# Patient Record
Sex: Female | Born: 1975
Health system: Southern US, Community
[De-identification: ages and names within clinical notes are randomized; demographics above are authoritative.]

## PROBLEM LIST (undated history)

## (undated) ENCOUNTER — Inpatient Hospital Stay (HOSPITAL_COMMUNITY): Payer: Self-pay

## (undated) DIAGNOSIS — D649 Anemia, unspecified: Secondary | ICD-10-CM

## (undated) DIAGNOSIS — R51 Headache: Secondary | ICD-10-CM

## (undated) DIAGNOSIS — R519 Headache, unspecified: Secondary | ICD-10-CM

## (undated) HISTORY — PX: LASIK: SHX215

## (undated) HISTORY — PX: WISDOM TOOTH EXTRACTION: SHX21

---

## 2003-06-09 ENCOUNTER — Inpatient Hospital Stay (HOSPITAL_COMMUNITY): Admission: AD | Admit: 2003-06-09 | Discharge: 2003-06-09 | Payer: Self-pay | Admitting: Obstetrics and Gynecology

## 2003-09-25 ENCOUNTER — Inpatient Hospital Stay (HOSPITAL_COMMUNITY): Admission: AD | Admit: 2003-09-25 | Discharge: 2003-09-27 | Payer: Self-pay | Admitting: Obstetrics and Gynecology

## 2003-10-21 ENCOUNTER — Other Ambulatory Visit: Admission: RE | Admit: 2003-10-21 | Discharge: 2003-10-21 | Payer: Self-pay | Admitting: Obstetrics and Gynecology

## 2003-10-28 ENCOUNTER — Encounter: Admission: RE | Admit: 2003-10-28 | Discharge: 2003-11-27 | Payer: Self-pay | Admitting: Obstetrics and Gynecology

## 2003-12-28 ENCOUNTER — Encounter: Admission: RE | Admit: 2003-12-28 | Discharge: 2004-01-27 | Payer: Self-pay | Admitting: Obstetrics and Gynecology

## 2004-02-27 ENCOUNTER — Encounter: Admission: RE | Admit: 2004-02-27 | Discharge: 2004-03-28 | Payer: Self-pay | Admitting: Obstetrics and Gynecology

## 2004-03-29 ENCOUNTER — Encounter: Admission: RE | Admit: 2004-03-29 | Discharge: 2004-04-28 | Payer: Self-pay | Admitting: Obstetrics and Gynecology

## 2004-05-29 ENCOUNTER — Encounter: Admission: RE | Admit: 2004-05-29 | Discharge: 2004-06-28 | Payer: Self-pay | Admitting: Obstetrics and Gynecology

## 2004-07-29 ENCOUNTER — Encounter: Admission: RE | Admit: 2004-07-29 | Discharge: 2004-08-28 | Payer: Self-pay | Admitting: Obstetrics and Gynecology

## 2004-08-29 ENCOUNTER — Encounter: Admission: RE | Admit: 2004-08-29 | Discharge: 2004-09-28 | Payer: Self-pay | Admitting: Obstetrics and Gynecology

## 2004-10-12 ENCOUNTER — Other Ambulatory Visit: Admission: RE | Admit: 2004-10-12 | Discharge: 2004-10-12 | Payer: Self-pay | Admitting: Obstetrics and Gynecology

## 2004-10-27 ENCOUNTER — Encounter: Admission: RE | Admit: 2004-10-27 | Discharge: 2004-11-26 | Payer: Self-pay | Admitting: Obstetrics and Gynecology

## 2004-12-27 ENCOUNTER — Encounter: Admission: RE | Admit: 2004-12-27 | Discharge: 2005-01-26 | Payer: Self-pay | Admitting: Obstetrics and Gynecology

## 2013-07-22 NOTE — L&D Delivery Note (Signed)
Delivery Note At 4:37 PM, after pushing for 1 hour, a viable female was delivered via Vaginal, Spontaneous Delivery (Presentation: ; Occiput Anterior).  APGAR: 9, 9; weight  pending Placenta status: Intact, Spontaneous.  Cord: 3 vessels with the following complications: None.  Cord pH: n/a  Anesthesia: None  Episiotomy: None Lacerations: None Suture Repair: n/a Est. Blood Loss (mL):  100cc  Mom to postpartum.  Baby to Couplet care / Skin to Skin.  Marlow BaarsCLARK, Domanick Cuccia 07/16/2014, 5:00 PM

## 2013-09-09 LAB — OB RESULTS CONSOLE GC/CHLAMYDIA
Chlamydia: NEGATIVE
GC PROBE AMP, GENITAL: NEGATIVE

## 2013-12-31 LAB — OB RESULTS CONSOLE HIV ANTIBODY (ROUTINE TESTING): HIV: NONREACTIVE

## 2013-12-31 LAB — OB RESULTS CONSOLE GC/CHLAMYDIA
CHLAMYDIA, DNA PROBE: NEGATIVE
GC PROBE AMP, GENITAL: NEGATIVE

## 2013-12-31 LAB — OB RESULTS CONSOLE ABO/RH

## 2013-12-31 LAB — OB RESULTS CONSOLE HEPATITIS B SURFACE ANTIGEN: Hepatitis B Surface Ag: NEGATIVE

## 2013-12-31 LAB — OB RESULTS CONSOLE RUBELLA ANTIBODY, IGM: Rubella: IMMUNE

## 2013-12-31 LAB — OB RESULTS CONSOLE RPR: RPR: NONREACTIVE

## 2014-01-03 LAB — OB RESULTS CONSOLE ABO/RH: RH Type: POSITIVE

## 2014-04-27 LAB — OB RESULTS CONSOLE RPR: RPR: NONREACTIVE

## 2014-06-23 LAB — OB RESULTS CONSOLE GBS: STREP GROUP B AG: NEGATIVE

## 2014-07-16 ENCOUNTER — Inpatient Hospital Stay (HOSPITAL_COMMUNITY): Payer: No Typology Code available for payment source | Admitting: Anesthesiology

## 2014-07-16 ENCOUNTER — Inpatient Hospital Stay (HOSPITAL_COMMUNITY)
Admission: AD | Admit: 2014-07-16 | Discharge: 2014-07-17 | DRG: 775 | Disposition: A | Discharge status: Home / Self Care | Payer: No Typology Code available for payment source | Source: Ambulatory Visit | Attending: Obstetrics | Admitting: Obstetrics

## 2014-07-16 ENCOUNTER — Encounter (HOSPITAL_COMMUNITY): Payer: Self-pay | Admitting: *Deleted

## 2014-07-16 DIAGNOSIS — Z3A39 39 weeks gestation of pregnancy: Secondary | ICD-10-CM | POA: Diagnosis present

## 2014-07-16 DIAGNOSIS — O09523 Supervision of elderly multigravida, third trimester: Secondary | ICD-10-CM

## 2014-07-16 LAB — HIV ANTIBODY (ROUTINE TESTING W REFLEX): HIV: NONREACTIVE

## 2014-07-16 LAB — CBC
HCT: 34.3 % — ABNORMAL LOW (ref 36.0–46.0)
HEMOGLOBIN: 11.6 g/dL — AB (ref 12.0–15.0)
MCH: 32.4 pg (ref 26.0–34.0)
MCHC: 33.8 g/dL (ref 30.0–36.0)
MCV: 95.8 fL (ref 78.0–100.0)
Platelets: 342 10*3/uL (ref 150–400)
RBC: 3.58 MIL/uL — AB (ref 3.87–5.11)
RDW: 14.3 % (ref 11.5–15.5)
WBC: 9.9 10*3/uL (ref 4.0–10.5)

## 2014-07-16 LAB — TYPE AND SCREEN
ABO/RH(D): O POS
Antibody Screen: NEGATIVE

## 2014-07-16 LAB — RPR

## 2014-07-16 LAB — ABO/RH: ABO/RH(D): O POS

## 2014-07-16 MED ORDER — ONDANSETRON HCL 4 MG/2ML IJ SOLN: 4.0000 mg | INTRAMUSCULAR | Status: DC | PRN

## 2014-07-16 MED ORDER — ACETAMINOPHEN 325 MG PO TABS: 650.0000 mg | ORAL_TABLET | ORAL | Status: DC | PRN

## 2014-07-16 MED ORDER — PHENYLEPHRINE 40 MCG/ML (10ML) SYRINGE FOR IV PUSH (FOR BLOOD PRESSURE SUPPORT)
80.0000 ug | PREFILLED_SYRINGE | INTRAVENOUS | Status: DC | PRN
  Filled 2014-07-16: qty 2
  Filled 2014-07-16: qty 10

## 2014-07-16 MED ORDER — LACTATED RINGERS IV SOLN
INTRAVENOUS | Status: DC
  Administered 2014-07-16 (×4): via INTRAVENOUS

## 2014-07-16 MED ORDER — BUTORPHANOL TARTRATE 1 MG/ML IJ SOLN
1.0000 mg | INTRAMUSCULAR | Status: DC | PRN
Start: 2014-07-16 — End: 2014-07-16
  Administered 2014-07-16: 1 mg via INTRAVENOUS
  Filled 2014-07-16: qty 1

## 2014-07-16 MED ORDER — PRENATAL MULTIVITAMIN CH
1.0000 | ORAL_TABLET | Freq: Every day | ORAL | Status: DC
  Administered 2014-07-17: 1 via ORAL
  Filled 2014-07-16 (×2): qty 1

## 2014-07-16 MED ORDER — TETANUS-DIPHTH-ACELL PERTUSSIS 5-2.5-18.5 LF-MCG/0.5 IM SUSP: 0.5000 mL | Freq: Once | INTRAMUSCULAR | Status: DC

## 2014-07-16 MED ORDER — OXYCODONE-ACETAMINOPHEN 5-325 MG PO TABS: 2.0000 | ORAL_TABLET | ORAL | Status: DC | PRN

## 2014-07-16 MED ORDER — SIMETHICONE 80 MG PO CHEW: 80.0000 mg | CHEWABLE_TABLET | ORAL | Status: DC | PRN

## 2014-07-16 MED ORDER — TERBUTALINE SULFATE 1 MG/ML IJ SOLN: 0.2500 mg | Freq: Once | INTRAMUSCULAR | Status: DC | PRN

## 2014-07-16 MED ORDER — FENTANYL 2.5 MCG/ML BUPIVACAINE 1/10 % EPIDURAL INFUSION (WH - ANES)
14.0000 mL/h | INTRAMUSCULAR | Status: DC | PRN
  Administered 2014-07-16: 14 mL/h via EPIDURAL
  Filled 2014-07-16 (×2): qty 125

## 2014-07-16 MED ORDER — CITRIC ACID-SODIUM CITRATE 334-500 MG/5ML PO SOLN: 30.0000 mL | ORAL | Status: DC | PRN

## 2014-07-16 MED ORDER — OXYTOCIN 40 UNITS IN LACTATED RINGERS INFUSION - SIMPLE MED: 62.5000 mL/h | INTRAVENOUS | Status: DC

## 2014-07-16 MED ORDER — FLEET ENEMA 7-19 GM/118ML RE ENEM: 1.0000 | ENEMA | RECTAL | Status: DC | PRN

## 2014-07-16 MED ORDER — OXYTOCIN BOLUS FROM INFUSION: 500.0000 mL | INTRAVENOUS | Status: DC

## 2014-07-16 MED ORDER — SENNOSIDES-DOCUSATE SODIUM 8.6-50 MG PO TABS
2.0000 | ORAL_TABLET | ORAL | Status: DC
  Filled 2014-07-16: qty 2

## 2014-07-16 MED ORDER — LIDOCAINE HCL (PF) 1 % IJ SOLN
30.0000 mL | INTRAMUSCULAR | Status: DC | PRN
  Filled 2014-07-16: qty 30

## 2014-07-16 MED ORDER — LIDOCAINE HCL (PF) 1 % IJ SOLN
INTRAMUSCULAR | Status: DC | PRN
  Administered 2014-07-16 (×2): 4 mL

## 2014-07-16 MED ORDER — DIBUCAINE 1 % RE OINT: 1.0000 "application " | TOPICAL_OINTMENT | RECTAL | Status: DC | PRN

## 2014-07-16 MED ORDER — PHENYLEPHRINE 40 MCG/ML (10ML) SYRINGE FOR IV PUSH (FOR BLOOD PRESSURE SUPPORT)
80.0000 ug | PREFILLED_SYRINGE | INTRAVENOUS | Status: DC | PRN
  Filled 2014-07-16: qty 2

## 2014-07-16 MED ORDER — LACTATED RINGERS IV SOLN: 500.0000 mL | Freq: Once | INTRAVENOUS | Status: DC

## 2014-07-16 MED ORDER — ONDANSETRON HCL 4 MG PO TABS: 4.0000 mg | ORAL_TABLET | ORAL | Status: DC | PRN

## 2014-07-16 MED ORDER — DIPHENHYDRAMINE HCL 25 MG PO CAPS: 25.0000 mg | ORAL_CAPSULE | Freq: Four times a day (QID) | ORAL | Status: DC | PRN

## 2014-07-16 MED ORDER — LACTATED RINGERS IV SOLN: 500.0000 mL | INTRAVENOUS | Status: DC | PRN

## 2014-07-16 MED ORDER — OXYTOCIN 40 UNITS IN LACTATED RINGERS INFUSION - SIMPLE MED
1.0000 m[IU]/min | INTRAVENOUS | Status: DC
  Administered 2014-07-16: 2 m[IU]/min via INTRAVENOUS
  Filled 2014-07-16: qty 1000

## 2014-07-16 MED ORDER — OXYCODONE-ACETAMINOPHEN 5-325 MG PO TABS: 1.0000 | ORAL_TABLET | ORAL | Status: DC | PRN

## 2014-07-16 MED ORDER — LANOLIN HYDROUS EX OINT: TOPICAL_OINTMENT | CUTANEOUS | Status: DC | PRN

## 2014-07-16 MED ORDER — BENZOCAINE-MENTHOL 20-0.5 % EX AERO: 1.0000 "application " | INHALATION_SPRAY | CUTANEOUS | Status: DC | PRN

## 2014-07-16 MED ORDER — IBUPROFEN 600 MG PO TABS
600.0000 mg | ORAL_TABLET | Freq: Four times a day (QID) | ORAL | Status: DC
  Administered 2014-07-16 – 2014-07-17 (×3): 600 mg via ORAL
  Filled 2014-07-16 (×3): qty 1

## 2014-07-16 MED ORDER — EPHEDRINE 5 MG/ML INJ
10.0000 mg | INTRAVENOUS | Status: DC | PRN
  Filled 2014-07-16: qty 2

## 2014-07-16 MED ORDER — FENTANYL 2.5 MCG/ML BUPIVACAINE 1/10 % EPIDURAL INFUSION (WH - ANES)
INTRAMUSCULAR | Status: DC | PRN
  Administered 2014-07-16: 14 mL/h via EPIDURAL

## 2014-07-16 MED ORDER — WITCH HAZEL-GLYCERIN EX PADS: 1.0000 "application " | MEDICATED_PAD | CUTANEOUS | Status: DC | PRN

## 2014-07-16 MED ORDER — DIPHENHYDRAMINE HCL 50 MG/ML IJ SOLN: 12.5000 mg | INTRAMUSCULAR | Status: DC | PRN

## 2014-07-16 MED ORDER — ONDANSETRON HCL 4 MG/2ML IJ SOLN: 4.0000 mg | Freq: Four times a day (QID) | INTRAMUSCULAR | Status: DC | PRN

## 2014-07-16 NOTE — H&P (Signed)
38 y.o. G2P1 @ 3675w2d presents with complaints of ctx q2-4 min and significant pelvic pressure.  She walked for 2 hrs and was unchanged at 3cm.  Otherwise has good fetal movement and no bleeding.  Pregnancy c/b:  1. AMA: NIPT low risk for anueploidy  History reviewed. No pertinent past medical history.  Past Surgical History  Procedure Laterality Date  . Lasik      OB History  Gravida Para Term Preterm AB SAB TAB Ectopic Multiple Living  2 1        1     # Outcome Date GA Lbr Len/2nd Weight Sex Delivery Anes PTL Lv  2 Current           1 Para               PP6#5oz  History   Social History  . Marital Status: Married    Spouse Name: N/A    Number of Children: N/A  . Years of Education: N/A   Occupational History  . Not on file.   Social History Main Topics  . Smoking status: Never Smoker   . Smokeless tobacco: Not on file  . Alcohol Use: No  . Drug Use: No  . Sexual Activity: Yes   Other Topics Concern  . Not on file   Social History Narrative  . No narrative on file   Review of patient's allergies indicates no known allergies.    Prenatal Transfer Tool  Maternal Diabetes: No Genetic Screening: Normal Maternal Ultrasounds/Referrals: Normal Fetal Ultrasounds or other Referrals:  None Maternal Substance Abuse:  No Significant Maternal Medications:  None Significant Maternal Lab Results: Lab values include: Group B Strep negative  ABO, Rh: --/--/O POS (12/26 0345) Antibody: NEG (12/26 0345) Rubella:  Immune RPR: Nonreactive (10/07 0000)  HBsAg: Negative (06/12 0000)  HIV: Non-reactive (06/12 0000)  GBS: Negative (12/03 0000)    Other PNC: uncomplicated.    Filed Vitals:   07/16/14 0801  BP: 120/60  Pulse: 84  Temp: 98.1 F (36.7 C)  Resp: 18     General:  NAD Lungs: CTAB Cardiac: RRR Abdomen:  soft, gravid, EFW 7# Ex:  tr edema SVE:  3-4/70/-2/posterior FHTs:  130s, mod var, + scalp stim w exam, Cat 1 Toco:  q2-4 min   A/P   38 y.o.  5675w2d  G2P1 presents with latent labor. Was contracting painfully q2-4 minutes in MAU, but did not change cervix.  Due to distance of 1 hr to hospital, pt strongly desired admission for labor augmentation with pitocin. Declines epidural. Declines AROM at this time.  Cont to titrate up pitocin. FSR/ vtx/ GBS neg  Marlow BaarsCLARK, Keland Peyton '

## 2014-07-16 NOTE — Anesthesia Procedure Notes (Signed)
Epidural Patient location during procedure: OB  Staffing Anesthesiologist: Dejane Scheibe R Performed by: anesthesiologist   Preanesthetic Checklist Completed: patient identified, pre-op evaluation, timeout performed, IV checked, risks and benefits discussed and monitors and equipment checked  Epidural Patient position: sitting Prep: site prepped and draped and DuraPrep Patient monitoring: heart rate Approach: midline Location: L3-L4 Injection technique: LOR air and LOR saline  Needle:  Needle type: Tuohy  Needle gauge: 17 G Needle length: 9 cm Needle insertion depth: 5 cm Catheter type: closed end flexible Catheter size: 19 Gauge Catheter at skin depth: 11 cm Test dose: negative  Assessment Sensory level: T8 Events: blood not aspirated, injection not painful, no injection resistance, negative IV test and no paresthesia  Additional Notes Reason for block:procedure for pain   

## 2014-07-16 NOTE — Progress Notes (Signed)
Breathing through contractions  SVE: 3-4/70/high, AROM clear fluid EFM: 120s, mod var, cat 1 Toco: q2-3 min on pitocin  G3P1 @ 371w2d w latent labor Cont augmentation with pitocin, not yet in active phase.  FSR

## 2014-07-16 NOTE — MAU Note (Signed)
Pt to walk for one hour and then be reexamined.

## 2014-07-16 NOTE — MAU Note (Signed)
Contractions since 2000 and have gotten closer. Denies LOF. Some bloody show after membranes stripped on Weds by Dr Chestine Sporelark

## 2014-07-16 NOTE — MAU Note (Signed)
Dr. Chestine Sporelark given report about pt's progress. Pt not wanting to go home as she lives one hour away. Dr. Chestine Sporelark to admit patient with pitocin augmentation orders.

## 2014-07-16 NOTE — Anesthesia Preprocedure Evaluation (Addendum)
Anesthesia Evaluation  Patient identified by MRN, date of birth, ID band Patient awake    Reviewed: Allergy & Precautions, H&P , NPO status , Patient's Chart, lab work & pertinent test results  Airway Mallampati: II TM Distance: >3 FB Neck ROM: Full    Dental no notable dental hx.    Pulmonary neg pulmonary ROS,  breath sounds clear to auscultation  Pulmonary exam normal       Cardiovascular negative cardio ROS  Rhythm:Regular Rate:Normal     Neuro/Psych negative neurological ROS  negative psych ROS   GI/Hepatic negative GI ROS, Neg liver ROS,   Endo/Other  negative endocrine ROS  Renal/GU negative Renal ROS     Musculoskeletal negative musculoskeletal ROS (+)   Abdominal   Peds  Hematology negative hematology ROS (+)   Anesthesia Other Findings   Reproductive/Obstetrics (+) Pregnancy                           Anesthesia Physical Anesthesia Plan  ASA: II  Anesthesia Plan: Epidural   Post-op Pain Management:    Induction:   Airway Management Planned:   Additional Equipment:   Intra-op Plan:   Post-operative Plan:   Informed Consent: I have reviewed the patients History and Physical, chart, labs and discussed the procedure including the risks, benefits and alternatives for the proposed anesthesia with the patient or authorized representative who has indicated his/her understanding and acceptance.     Plan Discussed with:   Anesthesia Plan Comments:         Anesthesia Quick Evaluation  

## 2014-07-17 LAB — CBC
HCT: 31 % — ABNORMAL LOW (ref 36.0–46.0)
Hemoglobin: 10.6 g/dL — ABNORMAL LOW (ref 12.0–15.0)
MCH: 32.7 pg (ref 26.0–34.0)
MCHC: 34.2 g/dL (ref 30.0–36.0)
MCV: 95.7 fL (ref 78.0–100.0)
Platelets: 277 10*3/uL (ref 150–400)
RBC: 3.24 MIL/uL — ABNORMAL LOW (ref 3.87–5.11)
RDW: 14.2 % (ref 11.5–15.5)
WBC: 14.3 10*3/uL — ABNORMAL HIGH (ref 4.0–10.5)

## 2014-07-17 MED ORDER — IBUPROFEN 600 MG PO TABS: 600.0000 mg | ORAL_TABLET | Freq: Four times a day (QID) | ORAL | Status: DC | PRN

## 2014-07-17 NOTE — Anesthesia Postprocedure Evaluation (Signed)
  Anesthesia Post-op Note  Anesthesia Post Note  Patient: Brittany Frey  Procedure(s) Performed: * No procedures listed *  Anesthesia type: Epidural  Patient location: Mother/Baby  Post pain: Pain level controlled  Post assessment: Post-op Vital signs reviewed  Last Vitals:  Filed Vitals:   07/17/14 0609  BP: 120/63  Pulse: 78  Temp: 36.7 C  Resp:     Post vital signs: Reviewed  Level of consciousness:alert  Complications: No apparent anesthesia complications

## 2014-07-17 NOTE — Discharge Summary (Signed)
Obstetric Discharge Summary Reason for Admission: onset of labor Prenatal Procedures: none Intrapartum Procedures: spontaneous vaginal delivery Postpartum Procedures: none Complications-Operative and Postpartum: none HEMOGLOBIN  Date Value Ref Range Status  07/17/2014 10.6* 12.0 - 15.0 g/dL Final   HCT  Date Value Ref Range Status  07/17/2014 31.0* 36.0 - 46.0 % Final    Physical Exam:  General: alert, cooperative and appears stated age 15Lochia: appropriate Uterine Fundus: firm DVT Evaluation: No evidence of DVT seen on physical exam.  Discharge Diagnoses: Term Pregnancy-delivered  Discharge Information: Date: 07/17/2014 Activity: pelvic rest Diet: routine Medications: PNV and Ibuprofen Condition: stable Instructions: refer to practice specific booklet Discharge to: home   Newborn Data: Live born female  Birth Weight: 7 lb 12.5 oz (3530 g) APGAR: 9, 9  Home with mother.  Marlow BaarsCLARK, Marcial Pless 07/17/2014, 9:13 AM

## 2014-07-17 NOTE — Discharge Instructions (Signed)

## 2014-07-17 NOTE — Progress Notes (Signed)
Patient is doing well.  She is ambulating, voiding, tolerating PO.  Pain control is good.  Lochia is appropriate  Filed Vitals:   07/16/14 2015 07/16/14 2140 07/17/14 0205 07/17/14 0609  BP: 121/64 133/74 110/44 120/63  Pulse: 87 85 78 78  Temp: 98.1 F (36.7 C) 98.2 F (36.8 C) 97.8 F (36.6 C) 98 F (36.7 C)  TempSrc: Oral Oral Oral Oral  Resp: 18 18 18    Height:      Weight:      SpO2: 98% 99%  100%    NAD Fundus firm Ext:   Lab Results  Component Value Date   WBC 14.3* 07/17/2014   HGB 10.6* 07/17/2014   HCT 31.0* 07/17/2014   MCV 95.7 07/17/2014   PLT 277 07/17/2014    --/--/O POS, O POS (12/26 0345)/RImmune  A/P 38 y.o. G2P1001 PPD#1 s/p TSVD. Routine care.   Meeting all goals Expect d/c today.    [place]ARK, [facility] CampusDYANNA

## 2014-07-17 NOTE — Lactation Note (Signed)
This note was copied from the chart of Brittany Inez Dufner. Lactation Consultation Note     Initial consult with this mom and baby, now 5817 hours old. Mom is an experienced brest fedder, who says brest feeding is going well, and does not need lactation . I gave mom lactation and community services hand outs. Mom smiled and thanked me.  Patient Name: Brittany Rush FarmerChrystal Frey ZOXWR'UToday's Date: 07/17/2014 Reason for consult: Initial assessment   Maternal Data    Feeding Feeding Type: Breast Fed Length of feed: 15 min  LATCH Score/Interventions                      Lactation Tools Discussed/Used     Consult Status Consult Status: Complete    Alfred LevinsLee, [name] Anne 07/17/2014, 10:34 AM

## 2015-10-26 ENCOUNTER — Other Ambulatory Visit: Payer: Self-pay | Admitting: Obstetrics and Gynecology

## 2015-10-26 ENCOUNTER — Encounter (HOSPITAL_COMMUNITY): Payer: Self-pay | Admitting: *Deleted

## 2015-10-26 NOTE — H&P (Signed)
40 y.o. yo G2P1001at about 10 weeks with MAB.  For D&E.  Past Medical History  Diagnosis Date  . SVD (spontaneous vaginal delivery)     x 2  . Headache     Hx - last one 05/2013  . Anemia     hx   Past Surgical History  Procedure Laterality Date  . Lasik    . Wisdom tooth extraction      Social History   Social History  . Marital Status: Married    Spouse Name: N/A  . Number of Children: N/A  . Years of Education: N/A   Occupational History  . Not on file.   Social History Main Topics  . Smoking status: Never Smoker   . Smokeless tobacco: Never Used  . Alcohol Use: No  . Drug Use: No  . Sexual Activity: Yes    Birth Control/ Protection: None     Comment: approx [redacted] wks gestation   Other Topics Concern  . Not on file   Social History Narrative    No current facility-administered medications on file prior to encounter.   Current Outpatient Prescriptions on File Prior to Encounter  Medication Sig Dispense Refill  . Prenatal Vit-Fe Fumarate-FA (PRENATAL MULTIVITAMIN) TABS tablet Take 1 tablet by mouth daily at 12 noon.      No Known Allergies  @VITALS2 @  Lungs: clear to ascultation Cor:  RRR Abdomen:  soft, nontender, nondistended. Ex:  no cords, erythema Pelvic:  Closed cervix, normal uterus about 10 weeks  A:  For D&E for MAB at 10 weeks.   P: All risks, benefits and alternatives d/w patient and she desires to proceed. SCDs during the operation.   Pt is  O+.  Nona Gracey A

## 2015-10-27 ENCOUNTER — Encounter (HOSPITAL_COMMUNITY)
Admission: RE | Disposition: A | Discharge status: Home / Self Care | Payer: Self-pay | Source: Ambulatory Visit | Attending: Obstetrics and Gynecology

## 2015-10-27 ENCOUNTER — Ambulatory Visit (HOSPITAL_COMMUNITY): Payer: 59 | Admitting: Anesthesiology

## 2015-10-27 ENCOUNTER — Encounter (HOSPITAL_COMMUNITY): Payer: Self-pay | Admitting: *Deleted

## 2015-10-27 ENCOUNTER — Ambulatory Visit (HOSPITAL_COMMUNITY)
Admission: RE | Admit: 2015-10-27 | Discharge: 2015-10-27 | Disposition: A | Discharge status: Home / Self Care | Payer: 59 | Source: Ambulatory Visit | Attending: Obstetrics and Gynecology | Admitting: Obstetrics and Gynecology

## 2015-10-27 ENCOUNTER — Ambulatory Visit (HOSPITAL_COMMUNITY): Payer: 59

## 2015-10-27 DIAGNOSIS — O021 Missed abortion: Secondary | ICD-10-CM | POA: Diagnosis present

## 2015-10-27 DIAGNOSIS — O029 Abnormal product of conception, unspecified: Secondary | ICD-10-CM

## 2015-10-27 HISTORY — DX: Headache, unspecified: R51.9

## 2015-10-27 HISTORY — DX: Headache: R51

## 2015-10-27 HISTORY — PX: DILATION AND EVACUATION: SHX1459

## 2015-10-27 HISTORY — DX: Anemia, unspecified: D64.9

## 2015-10-27 LAB — CBC
HEMATOCRIT: 37.5 % (ref 36.0–46.0)
HEMOGLOBIN: 12.7 g/dL (ref 12.0–15.0)
MCH: 31.4 pg (ref 26.0–34.0)
MCHC: 33.9 g/dL (ref 30.0–36.0)
MCV: 92.8 fL (ref 78.0–100.0)
Platelets: 307 10*3/uL (ref 150–400)
RBC: 4.04 MIL/uL (ref 3.87–5.11)
RDW: 13.4 % (ref 11.5–15.5)
WBC: 5.6 10*3/uL (ref 4.0–10.5)

## 2015-10-27 SURGERY — DILATION AND EVACUATION, UTERUS
Anesthesia: Monitor Anesthesia Care

## 2015-10-27 MED ORDER — MIDAZOLAM HCL 2 MG/2ML IJ SOLN
INTRAMUSCULAR | Status: AC
  Filled 2015-10-27: qty 2

## 2015-10-27 MED ORDER — IBUPROFEN 200 MG PO TABS
200.0000 mg | ORAL_TABLET | Freq: Four times a day (QID) | ORAL | Status: DC | PRN
  Filled 2015-10-27: qty 2

## 2015-10-27 MED ORDER — FENTANYL CITRATE (PF) 100 MCG/2ML IJ SOLN: 25.0000 ug | INTRAMUSCULAR | Status: DC | PRN

## 2015-10-27 MED ORDER — LIDOCAINE HCL (CARDIAC) 20 MG/ML IV SOLN
INTRAVENOUS | Status: DC | PRN
  Administered 2015-10-27: 100 mg via INTRAVENOUS

## 2015-10-27 MED ORDER — MIDAZOLAM HCL 2 MG/2ML IJ SOLN
INTRAMUSCULAR | Status: DC | PRN
  Administered 2015-10-27: 2 mg via INTRAVENOUS

## 2015-10-27 MED ORDER — DEXAMETHASONE SODIUM PHOSPHATE 10 MG/ML IJ SOLN
INTRAMUSCULAR | Status: DC | PRN
  Administered 2015-10-27: 4 mg via INTRAVENOUS

## 2015-10-27 MED ORDER — IBUPROFEN 100 MG/5ML PO SUSP
200.0000 mg | Freq: Four times a day (QID) | ORAL | Status: DC | PRN
  Filled 2015-10-27: qty 20

## 2015-10-27 MED ORDER — METHYLERGONOVINE MALEATE 0.2 MG/ML IJ SOLN
INTRAMUSCULAR | Status: DC | PRN
  Administered 2015-10-27: 0.2 mg via INTRAMUSCULAR

## 2015-10-27 MED ORDER — SCOPOLAMINE 1 MG/3DAYS TD PT72
1.0000 | MEDICATED_PATCH | Freq: Once | TRANSDERMAL | Status: DC
  Administered 2015-10-27: 1.5 mg via TRANSDERMAL

## 2015-10-27 MED ORDER — HYDROCODONE-ACETAMINOPHEN 7.5-325 MG PO TABS: 1.0000 | ORAL_TABLET | Freq: Once | ORAL | Status: DC | PRN

## 2015-10-27 MED ORDER — MEPERIDINE HCL 25 MG/ML IJ SOLN: 6.2500 mg | INTRAMUSCULAR | Status: DC | PRN

## 2015-10-27 MED ORDER — KETOROLAC TROMETHAMINE 30 MG/ML IJ SOLN
INTRAMUSCULAR | Status: DC | PRN
  Administered 2015-10-27: 30 mg via INTRAVENOUS

## 2015-10-27 MED ORDER — ONDANSETRON HCL 4 MG/2ML IJ SOLN: 4.0000 mg | Freq: Once | INTRAMUSCULAR | Status: DC | PRN

## 2015-10-27 MED ORDER — KETOROLAC TROMETHAMINE 30 MG/ML IJ SOLN: 30.0000 mg | Freq: Once | INTRAMUSCULAR | Status: DC

## 2015-10-27 MED ORDER — ONDANSETRON HCL 4 MG/2ML IJ SOLN
INTRAMUSCULAR | Status: DC | PRN
  Administered 2015-10-27: 4 mg via INTRAVENOUS

## 2015-10-27 MED ORDER — SCOPOLAMINE 1 MG/3DAYS TD PT72
MEDICATED_PATCH | TRANSDERMAL | Status: AC
  Administered 2015-10-27: 1.5 mg via TRANSDERMAL
  Filled 2015-10-27: qty 1

## 2015-10-27 MED ORDER — PROPOFOL 10 MG/ML IV BOLUS
INTRAVENOUS | Status: DC | PRN
Start: 2015-10-27 — End: 2015-10-27
  Administered 2015-10-27: 200 mg via INTRAVENOUS
  Administered 2015-10-27: 150 mg via INTRAVENOUS

## 2015-10-27 MED ORDER — LACTATED RINGERS IV SOLN
INTRAVENOUS | Status: DC
  Administered 2015-10-27 (×3): via INTRAVENOUS

## 2015-10-27 SURGICAL SUPPLY — 18 items
CATH ROBINSON RED A/P 16FR (CATHETERS) ×3 IMPLANT
CLOTH BEACON ORANGE TIMEOUT ST (SAFETY) ×3 IMPLANT
DECANTER SPIKE VIAL GLASS SM (MISCELLANEOUS) ×3 IMPLANT
GLOVE BIO SURGEON STRL SZ7 (GLOVE) ×3 IMPLANT
GLOVE BIOGEL PI IND STRL 7.0 (GLOVE) ×2 IMPLANT
GLOVE BIOGEL PI INDICATOR 7.0 (GLOVE) ×4
GOWN STRL REUS W/TWL LRG LVL3 (GOWN DISPOSABLE) ×6 IMPLANT
KIT BERKELEY 1ST TRIMESTER 3/8 (MISCELLANEOUS) ×3 IMPLANT
NS IRRIG 1000ML POUR BTL (IV SOLUTION) ×3 IMPLANT
PACK VAGINAL MINOR WOMEN LF (CUSTOM PROCEDURE TRAY) ×3 IMPLANT
PAD OB MATERNITY 4.3X12.25 (PERSONAL CARE ITEMS) ×3 IMPLANT
PAD PREP 24X48 CUFFED NSTRL (MISCELLANEOUS) ×3 IMPLANT
SET BERKELEY SUCTION TUBING (SUCTIONS) ×3 IMPLANT
TOWEL OR 17X24 6PK STRL BLUE (TOWEL DISPOSABLE) ×6 IMPLANT
VACURETTE 10 RIGID CVD (CANNULA) ×2 IMPLANT
VACURETTE 7MM CVD STRL WRAP (CANNULA) IMPLANT
VACURETTE 8 RIGID CVD (CANNULA) IMPLANT
VACURETTE 9 RIGID CVD (CANNULA) IMPLANT

## 2015-10-27 NOTE — Anesthesia Procedure Notes (Addendum)
Date/Time: 10/27/2015 3:08 PM Performed by: Armanda HeritageSTERLING, Okley Magnussen M   Procedure Name: LMA Insertion Date/Time: 10/27/2015 2:52 PM Performed by: Quentin Shorey, Jannet AskewHARLESETTA M Pre-anesthesia Checklist: Patient identified, Timeout performed, Emergency Drugs available, Suction available and Patient being monitored Patient Re-evaluated:Patient Re-evaluated prior to inductionOxygen Delivery Method: Circle system utilized Preoxygenation: Pre-oxygenation with 100% oxygen Intubation Type: IV induction LMA: LMA inserted LMA Size: 4.0 Number of attempts: 1

## 2015-10-27 NOTE — Transfer of Care (Signed)
Immediate Anesthesia Transfer of Care Note  Patient: Mazella Viti  Procedure(s) Performed: Procedure(s): DILATATION AND EVACUATION (N/A)  Patient Location: PACU  Anesthesia Type:General  Level of Consciousness: awake, alert  and oriented  Airway & Oxygen Therapy: Patient Spontanous Breathing and Patient connected to nasal cannula oxygen  Post-op Assessment: Report given to RN and Post -op Vital signs reviewed and stable  Post vital signs: Reviewed and stable  Last Vitals:  Filed Vitals:   10/27/15 1307  BP: 107/64  Pulse: 70  Temp: 36.8 C  Resp: 16    Complications: No apparent anesthesia complications

## 2015-10-27 NOTE — Brief Op Note (Signed)
10/27/2015  3:07 PM  PATIENT: Brittany Frey 39 y.o. female  PRE-OPERATIVE DIAGNOSIS: MAB  POST-OPERATIVE DIAGNOSIS: Missed Abortion  PROCEDURE: Procedure(s): DILATATION AND EVACUATION (N/Frey)  SURGEON: Surgeon(s) and Role:  * Thimothy Barretta, MD - Primary  ANESTHESIA: general  EBL: Total I/O In: -  Out: 175 [Urine:75; Blood:100]  SPECIMEN: Source of Specimen: uterine contents  DISPOSITION OF SPECIMEN: PATHOLOGY  COUNTS: YES  TOURNIQUET: * No tourniquets in log *  DICTATION: .Note written in EPIC  PLAN OF CARE: Discharge to home after PACU  PATIENT DISPOSITION: PACU - hemodynamically stable.  Delay start of Pharmacological VTE agent (>24hrs) due to surgical blood loss or risk of bleeding: not applicable  Medications: Methergine  Complications: None  Findings: 10 week size uterus to 8-9 size post procedure. Good crie was achieved.  After adequate anesthesia was achieved, the patient was prepped and draped in the usual sterile fashion. The speculum was placed in the vagina and the cervix stabilized with Frey single-tooth tenaculum. The cervix was dilated with Pratt dilators and the 10 mm curette was used to remove contents of the uterus. Alternating sharp curettage with Frey curette and suction curettage was performed until all contents were removed and good crie was achieved. All instruments were removed from the vagina. The patient tolerated the procedure well.   Brittany Frey                 

## 2015-10-27 NOTE — Discharge Instructions (Signed)
DISCHARGE INSTRUCTIONS: D&C / D&E The following instructions have been prepared to help you care for yourself upon your return home.   Personal hygiene:  Use sanitary pads for vaginal drainage, not tampons.  Shower the day after your procedure.  NO tub baths, pools or Jacuzzis for 2-3 weeks.  Wipe front to back after using the bathroom.  Activity and limitations:  Do NOT drive or operate any equipment for 24 hours. The effects of anesthesia are still present and drowsiness may result.  Do NOT rest in bed all day.  Walking is encouraged.  Walk up and down stairs slowly.  You may resume your normal activity in one to two days or as indicated by your physician.  Sexual activity: NO intercourse for at least 2 weeks after the procedure, or as indicated by your physician.  Diet: Eat a light meal as desired this evening. You may resume your usual diet tomorrow.  Return to work: You may resume your work activities in one to two days or as indicated by your doctor.  What to expect after your surgery: Expect to have vaginal bleeding/discharge for 2-3 days and spotting for up to 10 days. It is not unusual to have soreness for up to 1-2 weeks. You may have a slight burning sensation when you urinate for the first day. Mild cramps may continue for a couple of days. You may have a regular period in 2-6 weeks.   NO IBUPROFEN PRODUCTS (MOTRIN, ADVIL) OR ALEVE UNTIL 9:00PM TODAY.  Call your doctor for any of the following:  Excessive vaginal bleeding, saturating and changing one pad every hour.  Inability to urinate 6 hours after discharge from hospital.  Pain not relieved by pain medication.  Fever of 100.4 F or greater.  Unusual vaginal discharge or odor.   Call for an appointment:    Patients signature: ______________________  Nurses signature ________________________  Support person's signature_______________________

## 2015-10-27 NOTE — Progress Notes (Signed)
There has been no change in the patients history, status or exam since the history and physical.  Filed Vitals:   10/26/15 1125 10/27/15 1307  BP:  107/64  Pulse:  70  Temp:  98.3 F (36.8 C)  TempSrc:  Oral  Resp:  16  Height: 5\' 4"  (1.626 m)   Weight: 83.462 kg (184 lb)   SpO2:  100%    Lab Results  Component Value Date   WBC 5.6 10/27/2015   HGB 12.7 10/27/2015   HCT 37.5 10/27/2015   MCV 92.8 10/27/2015   PLT 307 10/27/2015    Saharah Sherrow A

## 2015-10-27 NOTE — Anesthesia Preprocedure Evaluation (Signed)
Anesthesia Evaluation  Patient identified by MRN, date of birth, ID band Patient awake    Reviewed: Allergy & Precautions, H&P , NPO status , Patient's Chart, lab work & pertinent test results  Airway Mallampati: I  TM Distance: >3 FB Neck ROM: full    Dental no notable dental hx. (+) Teeth Intact   Pulmonary neg pulmonary ROS,    Pulmonary exam normal        Cardiovascular negative cardio ROS Normal cardiovascular exam     Neuro/Psych negative psych ROS   GI/Hepatic negative GI ROS, Neg liver ROS,   Endo/Other  negative endocrine ROS  Renal/GU negative Renal ROS     Musculoskeletal   Abdominal Normal abdominal exam  (+)   Peds  Hematology   Anesthesia Other Findings   Reproductive/Obstetrics negative OB ROS                             Anesthesia Physical Anesthesia Plan  ASA: I  Anesthesia Plan: MAC and General   Post-op Pain Management:    Induction: Intravenous  Airway Management Planned: Mask and LMA  Additional Equipment:   Intra-op Plan:   Post-operative Plan:   Informed Consent:   Plan Discussed with: CRNA and Surgeon  Anesthesia Plan Comments:         Anesthesia Quick Evaluation

## 2015-10-28 NOTE — Anesthesia Postprocedure Evaluation (Signed)
Anesthesia Post Note  Patient: Brittany Frey  Procedure(s) Performed: Procedure(s) (LRB): DILATATION AND EVACUATION (N/A)  Patient location during evaluation: PACU Anesthesia Type: General Level of consciousness: awake and alert Pain management: pain level controlled Vital Signs Assessment: post-procedure vital signs reviewed and stable Respiratory status: spontaneous breathing, nonlabored ventilation, respiratory function stable and patient connected to nasal cannula oxygen Cardiovascular status: blood pressure returned to baseline and stable Postop Assessment: no signs of nausea or vomiting Anesthetic complications: no    Last Vitals:  Filed Vitals:   10/27/15 1630 10/27/15 1705  BP: 109/58 120/62  Pulse: 75 77  Temp: 36.9 C 36.8 C  Resp: 24 16    Last Pain: There were no vitals filed for this visit.               Kennieth RadFitzgerald, Ketzaly Cardella E

## 2015-10-30 ENCOUNTER — Encounter (HOSPITAL_COMMUNITY): Payer: Self-pay | Admitting: Obstetrics and Gynecology

## 2015-11-06 NOTE — Op Note (Signed)
10/27/2015  3:07 PM  PATIENT: Brittany Frey 40 y.o. female  PRE-OPERATIVE DIAGNOSIS: MAB  POST-OPERATIVE DIAGNOSIS: Missed Abortion  PROCEDURE: Procedure(s): DILATATION AND EVACUATION (N/Frey)  SURGEON: Surgeon(s) and Role:  * Carrington ClampMichelle Araina Butrick, MD - Primary  ANESTHESIA: general  EBL: Total I/O In: -  Out: 175 [Urine:75; Blood:100]  SPECIMEN: Source of Specimen: uterine contents  DISPOSITION OF SPECIMEN: PATHOLOGY  COUNTS: YES  TOURNIQUET: * No tourniquets in log *  DICTATION: .Note written in EPIC  PLAN OF CARE: Discharge to home after PACU  PATIENT DISPOSITION: PACU - hemodynamically stable.  Delay start of Pharmacological VTE agent (>24hrs) due to surgical blood loss or risk of bleeding: not applicable  Medications: Methergine  Complications: None  Findings: 10 week size uterus to 8-9 size post procedure. Good crie was achieved.  After adequate anesthesia was achieved, the patient was prepped and draped in the usual sterile fashion. The speculum was placed in the vagina and the cervix stabilized with Frey single-tooth tenaculum. The cervix was dilated with Shawnie PonsPratt dilators and the 10 mm curette was used to remove contents of the uterus. Alternating sharp curettage with Frey curette and suction curettage was performed until all contents were removed and good crie was achieved. All instruments were removed from the vagina. The patient tolerated the procedure well.   Brittany Frey

## 2015-12-06 ENCOUNTER — Other Ambulatory Visit: Payer: Self-pay | Admitting: Obstetrics and Gynecology

## 2016-03-20 DIAGNOSIS — H40023 Open angle with borderline findings, high risk, bilateral: Secondary | ICD-10-CM | POA: Diagnosis not present

## 2016-05-16 DIAGNOSIS — N925 Other specified irregular menstruation: Secondary | ICD-10-CM | POA: Diagnosis not present

## 2016-05-24 DIAGNOSIS — N925 Other specified irregular menstruation: Secondary | ICD-10-CM | POA: Diagnosis not present

## 2016-06-04 ENCOUNTER — Other Ambulatory Visit: Payer: Self-pay | Admitting: Obstetrics and Gynecology

## 2016-06-04 DIAGNOSIS — N925 Other specified irregular menstruation: Secondary | ICD-10-CM | POA: Diagnosis not present

## 2016-06-04 DIAGNOSIS — Z6832 Body mass index (BMI) 32.0-32.9, adult: Secondary | ICD-10-CM | POA: Diagnosis not present

## 2016-06-04 DIAGNOSIS — Z3480 Encounter for supervision of other normal pregnancy, unspecified trimester: Secondary | ICD-10-CM | POA: Diagnosis not present

## 2016-06-04 DIAGNOSIS — Z01419 Encounter for gynecological examination (general) (routine) without abnormal findings: Secondary | ICD-10-CM | POA: Diagnosis not present

## 2016-06-04 DIAGNOSIS — Z124 Encounter for screening for malignant neoplasm of cervix: Secondary | ICD-10-CM | POA: Diagnosis not present

## 2016-06-05 LAB — CYTOLOGY - PAP

## 2016-06-18 DIAGNOSIS — Z369 Encounter for antenatal screening, unspecified: Secondary | ICD-10-CM | POA: Diagnosis not present

## 2016-06-18 DIAGNOSIS — O3680X1 Pregnancy with inconclusive fetal viability, fetus 1: Secondary | ICD-10-CM | POA: Diagnosis not present

## 2016-06-18 DIAGNOSIS — O09521 Supervision of elderly multigravida, first trimester: Secondary | ICD-10-CM | POA: Diagnosis not present

## 2016-06-25 DIAGNOSIS — Z3480 Encounter for supervision of other normal pregnancy, unspecified trimester: Secondary | ICD-10-CM | POA: Diagnosis not present

## 2016-06-25 DIAGNOSIS — O09521 Supervision of elderly multigravida, first trimester: Secondary | ICD-10-CM | POA: Diagnosis not present

## 2016-06-25 MED FILL — CITRANATAL 90 DHA COMBO PAC: 90-1 & 300 | 30 days supply | Qty: 30 | Fill #0

## 2016-07-12 ENCOUNTER — Encounter (HOSPITAL_COMMUNITY): Payer: Self-pay

## 2016-07-12 ENCOUNTER — Encounter (HOSPITAL_COMMUNITY)
Admission: AD | Disposition: A | Discharge status: Home / Self Care | Payer: Self-pay | Source: Ambulatory Visit | Attending: Obstetrics and Gynecology

## 2016-07-12 ENCOUNTER — Ambulatory Visit (HOSPITAL_COMMUNITY)
Admission: AD | Admit: 2016-07-12 | Discharge: 2016-07-12 | Disposition: A | Discharge status: Home / Self Care | Payer: 59 | Source: Ambulatory Visit | Attending: Obstetrics and Gynecology | Admitting: Obstetrics and Gynecology

## 2016-07-12 ENCOUNTER — Ambulatory Visit (HOSPITAL_COMMUNITY): Payer: 59 | Admitting: Anesthesiology

## 2016-07-12 DIAGNOSIS — O021 Missed abortion: Secondary | ICD-10-CM | POA: Insufficient documentation

## 2016-07-12 DIAGNOSIS — Z6832 Body mass index (BMI) 32.0-32.9, adult: Secondary | ICD-10-CM | POA: Insufficient documentation

## 2016-07-12 DIAGNOSIS — O3680X0 Pregnancy with inconclusive fetal viability, not applicable or unspecified: Secondary | ICD-10-CM | POA: Diagnosis not present

## 2016-07-12 DIAGNOSIS — S61212A Laceration without foreign body of right middle finger without damage to nail, initial encounter: Secondary | ICD-10-CM | POA: Diagnosis not present

## 2016-07-12 DIAGNOSIS — Z79899 Other long term (current) drug therapy: Secondary | ICD-10-CM | POA: Insufficient documentation

## 2016-07-12 HISTORY — PX: DILATION AND EVACUATION: SHX1459

## 2016-07-12 LAB — CBC
HCT: 36.9 % (ref 36.0–46.0)
HEMOGLOBIN: 12.4 g/dL (ref 12.0–15.0)
MCH: 31.3 pg (ref 26.0–34.0)
MCHC: 33.6 g/dL (ref 30.0–36.0)
MCV: 93.2 fL (ref 78.0–100.0)
Platelets: 342 10*3/uL (ref 150–400)
RBC: 3.96 MIL/uL (ref 3.87–5.11)
RDW: 13.3 % (ref 11.5–15.5)
WBC: 6.5 10*3/uL (ref 4.0–10.5)

## 2016-07-12 SURGERY — DILATION AND EVACUATION, UTERUS
Anesthesia: Monitor Anesthesia Care | Site: Vagina

## 2016-07-12 MED ORDER — MEPERIDINE HCL 25 MG/ML IJ SOLN: 6.2500 mg | INTRAMUSCULAR | Status: DC | PRN

## 2016-07-12 MED ORDER — FENTANYL CITRATE (PF) 100 MCG/2ML IJ SOLN
INTRAMUSCULAR | Status: AC
  Filled 2016-07-12: qty 2

## 2016-07-12 MED ORDER — DEXAMETHASONE SODIUM PHOSPHATE 10 MG/ML IJ SOLN
INTRAMUSCULAR | Status: DC | PRN
  Administered 2016-07-12: 10 mg via INTRAVENOUS

## 2016-07-12 MED ORDER — METHYLERGONOVINE MALEATE 0.2 MG/ML IJ SOLN
INTRAMUSCULAR | Status: DC | PRN
  Administered 2016-07-12: 0.2 mg via INTRAMUSCULAR

## 2016-07-12 MED ORDER — METHYLERGONOVINE MALEATE 0.2 MG/ML IJ SOLN
INTRAMUSCULAR | Status: AC
  Filled 2016-07-12: qty 1

## 2016-07-12 MED ORDER — ONDANSETRON HCL 4 MG/2ML IJ SOLN
INTRAMUSCULAR | Status: DC | PRN
Start: 2016-07-12 — End: 2016-07-12
  Administered 2016-07-12: 4 mg via INTRAVENOUS

## 2016-07-12 MED ORDER — MIDAZOLAM HCL 2 MG/2ML IJ SOLN
INTRAMUSCULAR | Status: AC
  Filled 2016-07-12: qty 2

## 2016-07-12 MED ORDER — SCOPOLAMINE 1 MG/3DAYS TD PT72
MEDICATED_PATCH | TRANSDERMAL | Status: AC
  Administered 2016-07-12: 1.5 mg via TRANSDERMAL
  Filled 2016-07-12: qty 1

## 2016-07-12 MED ORDER — MIDAZOLAM HCL 2 MG/2ML IJ SOLN
INTRAMUSCULAR | Status: DC | PRN
  Administered 2016-07-12: 2 mg via INTRAVENOUS

## 2016-07-12 MED ORDER — KETOROLAC TROMETHAMINE 30 MG/ML IJ SOLN
INTRAMUSCULAR | Status: DC | PRN
  Administered 2016-07-12: 30 mg via INTRAVENOUS

## 2016-07-12 MED ORDER — SCOPOLAMINE 1 MG/3DAYS TD PT72
1.0000 | MEDICATED_PATCH | Freq: Once | TRANSDERMAL | Status: DC
  Administered 2016-07-12: 1.5 mg via TRANSDERMAL

## 2016-07-12 MED ORDER — LIDOCAINE HCL (CARDIAC) 20 MG/ML IV SOLN
INTRAVENOUS | Status: DC | PRN
  Administered 2016-07-12: 30 mg via INTRAVENOUS

## 2016-07-12 MED ORDER — GLYCOPYRROLATE 0.2 MG/ML IJ SOLN
INTRAMUSCULAR | Status: DC | PRN
  Administered 2016-07-12: 0.2 mg via INTRAVENOUS

## 2016-07-12 MED ORDER — ONDANSETRON HCL 4 MG/2ML IJ SOLN
INTRAMUSCULAR | Status: AC
  Filled 2016-07-12: qty 2

## 2016-07-12 MED ORDER — FENTANYL CITRATE (PF) 100 MCG/2ML IJ SOLN: 25.0000 ug | INTRAMUSCULAR | Status: DC | PRN

## 2016-07-12 MED ORDER — PROPOFOL 10 MG/ML IV BOLUS
INTRAVENOUS | Status: AC
  Filled 2016-07-12: qty 20

## 2016-07-12 MED ORDER — LACTATED RINGERS IV SOLN
INTRAVENOUS | Status: DC
  Administered 2016-07-12 (×2): via INTRAVENOUS

## 2016-07-12 MED ORDER — PROMETHAZINE HCL 25 MG/ML IJ SOLN: 6.2500 mg | INTRAMUSCULAR | Status: DC | PRN

## 2016-07-12 MED ORDER — KETOROLAC TROMETHAMINE 30 MG/ML IJ SOLN
INTRAMUSCULAR | Status: AC
  Filled 2016-07-12: qty 1

## 2016-07-12 MED ORDER — PROPOFOL 10 MG/ML IV BOLUS
INTRAVENOUS | Status: DC | PRN
  Administered 2016-07-12: 200 mg via INTRAVENOUS
  Administered 2016-07-12: 100 mg via INTRAVENOUS

## 2016-07-12 MED ORDER — FENTANYL CITRATE (PF) 100 MCG/2ML IJ SOLN
INTRAMUSCULAR | Status: DC | PRN
  Administered 2016-07-12: 50 ug via INTRAVENOUS
  Administered 2016-07-12: 100 ug via INTRAVENOUS
  Administered 2016-07-12: 50 ug via INTRAVENOUS

## 2016-07-12 MED ORDER — MIDAZOLAM HCL 2 MG/2ML IJ SOLN: 0.5000 mg | Freq: Once | INTRAMUSCULAR | Status: DC | PRN

## 2016-07-12 MED ORDER — DEXAMETHASONE SODIUM PHOSPHATE 10 MG/ML IJ SOLN
INTRAMUSCULAR | Status: AC
  Filled 2016-07-12: qty 1

## 2016-07-12 SURGICAL SUPPLY — 18 items
CATH ROBINSON RED A/P 16FR (CATHETERS) ×3 IMPLANT
CLOTH BEACON ORANGE TIMEOUT ST (SAFETY) ×3 IMPLANT
DECANTER SPIKE VIAL GLASS SM (MISCELLANEOUS) ×3 IMPLANT
GLOVE BIO SURGEON STRL SZ7 (GLOVE) ×3 IMPLANT
GLOVE BIOGEL PI IND STRL 7.0 (GLOVE) ×2 IMPLANT
GLOVE BIOGEL PI INDICATOR 7.0 (GLOVE) ×4
GOWN STRL REUS W/TWL LRG LVL3 (GOWN DISPOSABLE) ×6 IMPLANT
KIT BERKELEY 1ST TRIMESTER 3/8 (MISCELLANEOUS) ×3 IMPLANT
NS IRRIG 1000ML POUR BTL (IV SOLUTION) ×3 IMPLANT
PACK VAGINAL MINOR WOMEN LF (CUSTOM PROCEDURE TRAY) ×3 IMPLANT
PAD OB MATERNITY 4.3X12.25 (PERSONAL CARE ITEMS) ×3 IMPLANT
PAD PREP 24X48 CUFFED NSTRL (MISCELLANEOUS) ×3 IMPLANT
SET BERKELEY SUCTION TUBING (SUCTIONS) ×3 IMPLANT
TOWEL OR 17X24 6PK STRL BLUE (TOWEL DISPOSABLE) ×6 IMPLANT
VACURETTE 10 RIGID CVD (CANNULA) ×2 IMPLANT
VACURETTE 7MM CVD STRL WRAP (CANNULA) IMPLANT
VACURETTE 8 RIGID CVD (CANNULA) IMPLANT
VACURETTE 9 RIGID CVD (CANNULA) IMPLANT

## 2016-07-12 NOTE — Anesthesia Postprocedure Evaluation (Signed)
Anesthesia Post Note  Patient: Marjory Grandfield  Procedure(s) Performed: Procedure(s) (LRB): DILATATION AND EVACUATION (N/A)  Patient location during evaluation: PACU Anesthesia Type: General Level of consciousness: awake and alert, oriented and patient cooperative Pain management: pain level controlled Vital Signs Assessment: post-procedure vital signs reviewed and stable Respiratory status: spontaneous breathing, nonlabored ventilation and respiratory function stable Cardiovascular status: blood pressure returned to baseline and stable Postop Assessment: no signs of nausea or vomiting Anesthetic complications: no        Last Vitals:  Vitals:   07/12/16 1425 07/12/16 1430  BP: (!) 107/58 (!) 105/56  Pulse:    Resp: 19 15  Temp: 36.8 C     Last Pain:  Vitals:   07/12/16 1145  TempSrc: Oral   Pain Goal: Patients Stated Pain Goal: 3 (07/12/16 1145)               Sarah-Jane Nazario,E. Anisha Starliper

## 2016-07-12 NOTE — Op Note (Signed)
07/12/2016  1:48 PM  PATIENT:  Brittany Frey  40 y.o. female  PRE-OPERATIVE DIAGNOSIS:  MAB at 11weeks  POST-OPERATIVE DIAGNOSIS:  MAB at 11weeks  PROCEDURE:  Procedure(s): DILATATION AND EVACUATION (N/A)  SURGEON:  Surgeon(s) and Role:    * Carrington ClampMichelle Tamari Busic, MD - Primary  ANESTHESIA:   general  EBL:  Total I/O In: 1000 [I.V.:1000] Out: 100 [Blood:100]  SPECIMEN:  Source of Specimen:  uterine contents  DISPOSITION OF SPECIMEN:  PATHOLOGY  COUNTS:  YES  TOURNIQUET:  * No tourniquets in log *  DICTATION: .Note written in EPIC  PLAN OF CARE: Discharge to home after PACU  PATIENT DISPOSITION:  PACU - hemodynamically stable.   Delay start of Pharmacological VTE agent (>24hrs) due to surgical blood loss or risk of bleeding: not applicable  Medications: Methergine  Complications: None  Findings:  12 week size uterus to 9 size post procedure.  Good crie was achieved.  After adequate anesthesia was achieved, the patient was prepped and draped in the usual sterile fashion.  The speculum was placed in the vagina and the cervix stabilized with a single-tooth tenaculum.  The cervix was dilated with Shawnie PonsPratt dilators and the 10 mm curette was used to remove contents of the uterus.  Alternating sharp curettage with a curette and suction curettage was performed until all contents were removed and good crie was achieved.  All instruments were removed from the vagina.  The patient tolerated the procedure well.    Lashuna Tamashiro A

## 2016-07-12 NOTE — Brief Op Note (Signed)
07/12/2016  1:48 PM  PATIENT:  Brittany Frey  40 y.o. female  PRE-OPERATIVE DIAGNOSIS:  MAB at 11weeks  POST-OPERATIVE DIAGNOSIS:  MAB at 11weeks  PROCEDURE:  Procedure(s): DILATATION AND EVACUATION (N/Frey)  SURGEON:  Surgeon(s) and Role:    * Tecora Eustache, MD - Primary  ANESTHESIA:   general  EBL:  Total I/O In: 1000 [I.V.:1000] Out: 100 [Blood:100]  SPECIMEN:  Source of Specimen:  uterine contents  DISPOSITION OF SPECIMEN:  PATHOLOGY  COUNTS:  YES  TOURNIQUET:  * No tourniquets in log *  DICTATION: .Note written in EPIC  PLAN OF CARE: Discharge to home after PACU  PATIENT DISPOSITION:  PACU - hemodynamically stable.   Delay start of Pharmacological VTE agent (>24hrs) due to surgical blood loss or risk of bleeding: not applicable  Medications: Methergine  Complications: None  Findings:  12 week size uterus to 9 size post procedure.  Good crie was achieved.  After adequate anesthesia was achieved, the patient was prepped and draped in the usual sterile fashion.  The speculum was placed in the vagina and the cervix stabilized with Frey single-tooth tenaculum.  The cervix was dilated with Pratt dilators and the 10 mm curette was used to remove contents of the uterus.  Alternating sharp curettage with Frey curette and suction curettage was performed until all contents were removed and good crie was achieved.  All instruments were removed from the vagina.  The patient tolerated the procedure well.    Brittany Frey       

## 2016-07-12 NOTE — Discharge Instructions (Signed)

## 2016-07-12 NOTE — H&P (Signed)
40 y.o. G2P1001 with MAB at 11 weeks.  For D&E.  Past Medical History:  Diagnosis Date  . Anemia    hx  . Headache    Hx - last one 05/2013  . SVD (spontaneous vaginal delivery)    x 2   Past Surgical History:  Procedure Laterality Date  . DILATION AND EVACUATION N/A 10/27/2015   Procedure: DILATATION AND EVACUATION;  Surgeon: Carrington ClampMichelle Delvis Kau, MD;  Location: [facility];  Service: Gynecology;  Laterality: N/A;  . LASIK    . WISDOM TOOTH EXTRACTION      Social History   Social History  . Marital status: Married    Spouse name: N/A  . Number of children: N/A  . Years of education: N/A   Occupational History  . Not on file.   Social History Main Topics  . Smoking status: Never Smoker  . Smokeless tobacco: Never Used  . Alcohol use No  . Drug use: No  . Sexual activity: Yes    Birth control/ protection: None     Comment: approx [redacted] wks gestation   Other Topics Concern  . Not on file   Social History Narrative  . No narrative on file    No current facility-administered medications on file prior to encounter.    Current Outpatient Prescriptions on File Prior to Encounter  Medication Sig Dispense Refill  . acetaminophen (TYLENOL) 500 MG tablet Take 1,000 mg by mouth every 6 (six) hours as needed for mild pain or headache.    Marland Kitchen. acyclovir (ZOVIRAX) 800 MG tablet Take 1 tablet by mouth daily as needed. For coldsores    . folic acid (FOLVITE) 400 MCG tablet Take 400 mcg by mouth daily.    . Prenatal Vit-Fe Fumarate-FA (PRENATAL MULTIVITAMIN) TABS tablet Take 1 tablet by mouth daily at 12 noon.      No Known Allergies  @VITALS2 @  Lungs: clear to ascultation Cor:  RRR Abdomen:  soft, nontender, nondistended. Ex:  no cords, erythema Pelvic:  About 11 weeks, cervix closed.  A:  For D&E for MAB.  Blood type O+.  P: All risks, benefits and alternatives d/w patient and she desires to proceed.   SCDs during the operation.     Waqas Bruhl A

## 2016-07-12 NOTE — Anesthesia Preprocedure Evaluation (Signed)
Anesthesia Evaluation  Patient identified by MRN, date of birth, ID band Patient awake    Reviewed: Allergy & Precautions, NPO status , Patient's Chart, lab work & pertinent test results  History of Anesthesia Complications Negative for: history of anesthetic complications  Airway Mallampati: IV  TM Distance: >3 FB Neck ROM: Full    Dental  (+) Dental Advisory Given   Pulmonary neg pulmonary ROS,    breath sounds clear to auscultation       Cardiovascular negative cardio ROS   Rhythm:Regular Rate:Normal     Neuro/Psych negative neurological ROS     GI/Hepatic negative GI ROS, Neg liver ROS,   Endo/Other  Morbid obesity  Renal/GU negative Renal ROS     Musculoskeletal   Abdominal (+) + obese,   Peds  Hematology plt 342k   Anesthesia Other Findings   Reproductive/Obstetrics (+) Pregnancy Missed Ab                             Anesthesia Physical Anesthesia Plan  ASA: I  Anesthesia Plan: MAC   Post-op Pain Management:    Induction: Intravenous  Airway Management Planned: Natural Airway  Additional Equipment:   Intra-op Plan:   Post-operative Plan:   Informed Consent: I have reviewed the patients History and Physical, chart, labs and discussed the procedure including the risks, benefits and alternatives for the proposed anesthesia with the patient or authorized representative who has indicated his/her understanding and acceptance.   Dental advisory given  Plan Discussed with: CRNA and Surgeon  Anesthesia Plan Comments: (Plan routine monitors, MAC)        Anesthesia Quick Evaluation

## 2016-07-12 NOTE — Anesthesia Procedure Notes (Signed)
Procedure Name: LMA Insertion Date/Time: 07/12/2016 1:24 PM Performed by: Rica RecordsICKELTON, Brittany Frey Pre-anesthesia Checklist: Patient identified, Emergency Drugs available, Suction available and Patient being monitored Patient Re-evaluated:Patient Re-evaluated prior to inductionOxygen Delivery Method: Circle system utilized Preoxygenation: Pre-oxygenation with 100% oxygen Intubation Type: IV induction Ventilation: Mask ventilation without difficulty LMA: LMA inserted LMA Size: 3.0 Dental Injury: Teeth and Oropharynx as per pre-operative assessment

## 2016-07-12 NOTE — Transfer of Care (Signed)
Immediate Anesthesia Transfer of Care Note  Patient: Brittany Frey  Procedure(s) Performed: Procedure(s): DILATATION AND EVACUATION (N/A)  Patient Location: PACU  Anesthesia Type:General  Level of Consciousness: awake, alert  and oriented  Airway & Oxygen Therapy: Patient Spontanous Breathing and Patient connected to nasal cannula oxygen  Post-op Assessment: Report given to RN and Post -op Vital signs reviewed and stable  Post vital signs: Reviewed and stable  Last Vitals:  Vitals:   07/12/16 1425 07/12/16 1430  BP: (!) 107/58 (!) 105/56  Pulse:    Resp: 19 15  Temp: 36.8 C     Last Pain:  Vitals:   07/12/16 1145  TempSrc: Oral      Patients Stated Pain Goal: 3 (07/12/16 1145)  Complications: No apparent anesthesia complications

## 2016-07-16 ENCOUNTER — Encounter (HOSPITAL_COMMUNITY): Payer: Self-pay | Admitting: Obstetrics and Gynecology

## 2016-07-19 DIAGNOSIS — Z029 Encounter for administrative examinations, unspecified: Secondary | ICD-10-CM | POA: Diagnosis not present

## 2016-07-19 MED FILL — miSOPROStol 200 MCG TABS: 200 | 1 days supply | Qty: 4 | Fill #0

## 2016-07-19 MED FILL — OXYCODONE W/APAP 5/325 TAB: 5-325 | 5 days supply | Qty: 20 | Fill #0

## 2016-07-30 DIAGNOSIS — E8881 Metabolic syndrome: Secondary | ICD-10-CM | POA: Diagnosis not present

## 2016-07-30 DIAGNOSIS — M79602 Pain in left arm: Secondary | ICD-10-CM | POA: Diagnosis not present

## 2016-07-30 DIAGNOSIS — Z0000 Encounter for general adult medical examination without abnormal findings: Secondary | ICD-10-CM | POA: Diagnosis not present

## 2016-07-30 DIAGNOSIS — K219 Gastro-esophageal reflux disease without esophagitis: Secondary | ICD-10-CM | POA: Diagnosis not present

## 2016-07-30 DIAGNOSIS — B351 Tinea unguium: Secondary | ICD-10-CM | POA: Diagnosis not present

## 2016-07-30 DIAGNOSIS — E669 Obesity, unspecified: Secondary | ICD-10-CM | POA: Diagnosis not present

## 2016-08-01 MED FILL — TERBINAFINE HCL 250 MG TAB: 250 | 45 days supply | Qty: 45 | Fill #0

## 2016-08-09 ENCOUNTER — Emergency Department (HOSPITAL_COMMUNITY)
Admission: EM | Admit: 2016-08-09 | Discharge: 2016-08-10 | Disposition: A | Discharge status: Home / Self Care | Payer: 59 | Attending: Emergency Medicine | Admitting: Emergency Medicine

## 2016-08-09 ENCOUNTER — Encounter (HOSPITAL_COMMUNITY): Payer: Self-pay | Admitting: Emergency Medicine

## 2016-08-09 DIAGNOSIS — Y939 Activity, unspecified: Secondary | ICD-10-CM | POA: Diagnosis not present

## 2016-08-09 DIAGNOSIS — S61212A Laceration without foreign body of right middle finger without damage to nail, initial encounter: Secondary | ICD-10-CM | POA: Diagnosis not present

## 2016-08-09 DIAGNOSIS — Z79899 Other long term (current) drug therapy: Secondary | ICD-10-CM | POA: Insufficient documentation

## 2016-08-09 DIAGNOSIS — W25XXXA Contact with sharp glass, initial encounter: Secondary | ICD-10-CM | POA: Insufficient documentation

## 2016-08-09 DIAGNOSIS — Y999 Unspecified external cause status: Secondary | ICD-10-CM | POA: Insufficient documentation

## 2016-08-09 DIAGNOSIS — Y929 Unspecified place or not applicable: Secondary | ICD-10-CM | POA: Insufficient documentation

## 2016-08-09 MED ORDER — LIDOCAINE HCL (PF) 1 % IJ SOLN
5.0000 mL | Freq: Once | INTRAMUSCULAR | Status: AC
  Administered 2016-08-09: 5 mL
  Filled 2016-08-09: qty 5

## 2016-08-09 NOTE — ED Notes (Signed)
See EDP assessment 

## 2016-08-09 NOTE — ED Triage Notes (Signed)
Pt reports cleaning when she dropped a crockpot and it shattered, putting a laceration to her right hand middle finger. Pt reports it was numb at first and now the feeling is returning. Pt pulses 4+/4+ and cap refill WNL.

## 2016-08-09 NOTE — ED Provider Notes (Signed)
[facility] Provider Note   CSN: [id number] Arrival date & time:      By signing my name below, I, Avnee Patel, attest that this documentation has been prepared under the direction and in the presence of  Kerrie Buffalo, NP. Electronically Signed: Clovis Pu, ED Scribe. 08/09/16. 11:11 PM.   History   Chief Complaint Chief Complaint  Patient presents with  . Extremity Laceration   The history is provided by the patient. No language interpreter was used.  Laceration   The incident occurred 1 to 2 hours ago. The laceration is located on the right hand. The laceration mechanism was a broken glass. The pain is moderate. She reports no foreign bodies present. Her tetanus status is UTD.   HPI Comments:  Brittany Frey is a 41 y.o. female who presents to the Emergency Department complaining of sudden onset, moderate laceration to her right middle finger which occurred PTA. Pt reports she sustained her laceration from a cracked crock pot. No alleviating factors noted. Pt denies any other complaints at this time. Tetanus is UTD.    Past Medical History:  Diagnosis Date  . Anemia    hx  . Headache    Hx - last one 05/2013  . SVD (spontaneous vaginal delivery)    x 2    Patient Active Problem List   Diagnosis Date Noted  . Indication for care in labor or delivery 07/16/2014  . Normal spontaneous vaginal delivery 07/16/2014    Past Surgical History:  Procedure Laterality Date  . DILATION AND EVACUATION N/A 10/27/2015   Procedure: DILATATION AND EVACUATION;  Surgeon: Carrington Clamp, MD;  Location: [facility];  Service: Gynecology;  Laterality: N/A;  . DILATION AND EVACUATION N/A 07/12/2016   Procedure: DILATATION AND EVACUATION;  Surgeon: Carrington Clamp, MD;  Location: [facility];  Service: Gynecology;  Laterality: N/A;  . LASIK    . WISDOM TOOTH EXTRACTION      OB History    Gravida Para Term Preterm AB Living   2 2 1     1    SAB TAB Ectopic Multiple Live Births     0 1       Home Medications    Prior to Admission medications   Medication Sig Start Date End Date Taking? Authorizing Provider  acetaminophen (TYLENOL) 500 MG tablet Take 1,000 mg by mouth every 6 (six) hours as needed for mild pain or headache.    Historical Provider, MD  acyclovir (ZOVIRAX) 800 MG tablet Take 1 tablet by mouth daily as needed. For coldsores 10/02/15   Historical Provider, MD  folic acid (FOLVITE) 400 MCG tablet Take 400 mcg by mouth daily.    Historical Provider, MD  Prenatal Vit-Fe Fumarate-FA (PRENATAL MULTIVITAMIN) TABS tablet Take 1 tablet by mouth daily at 12 noon.    Historical Provider, MD    Family History No family history on file.  Social History Social History  Substance Use Topics  . Smoking status: Never Smoker  . Smokeless tobacco: Never Used  . Alcohol use No     Allergies   Patient has no known allergies.   Review of Systems Review of Systems  Skin: Positive for wound.  Neurological: Negative for numbness.   Physical Exam Updated Vital Signs BP 122/78   Pulse 79   Temp 98 F (36.7 C) (Oral)   Resp 18   Ht 5\' 4"  (1.626 m)   Wt 81.6 kg   SpO2 100%   BMI 30.90 kg/m  Physical Exam  Constitutional: She is oriented to person, place, and time. She appears well-developed and well-nourished. No distress.  HENT:  Head: Normocephalic and atraumatic.  Eyes: Conjunctivae are normal.  Neck: Neck supple.  Cardiovascular: Tachycardia present.   Pulmonary/Chest: Effort normal.  Musculoskeletal: Normal range of motion.       Right hand: She exhibits tenderness and laceration. She exhibits normal range of motion and normal capillary refill. Normal sensation noted. Normal strength noted. She exhibits no finger abduction.  Laceration of the finger. No tendon involvement noted. Good strength and sensation.   Neurological: She is alert and oriented to person, place, and time.  Good strength and sensation is intact.   Skin: Skin is warm and  dry.  1.5 cm v-shaped flat laceration to the right middle finger at the PIP.  Psychiatric: She has a normal mood and affect.  Nursing note and vitals reviewed.   ED Treatments / Results  DIAGNOSTIC STUDIES:  Oxygen Saturation is 99% on RA, normal by my interpretation.    COORDINATION OF CARE:  11:10 PM Discussed treatment plan with pt at bedside and pt agreed to plan.  Labs (all labs ordered are listed, but only abnormal results are displayed) Labs Reviewed - No data to display  Radiology No results found.  Procedures .Marland KitchenLaceration Repair Date/Time: 08/09/2016 11:12 PM Performed by: Janne Napoleon Authorized by: Janne Napoleon   Consent:    Consent obtained:  Verbal   Consent given by:  Patient Anesthesia (see MAR for exact dosages):    Anesthesia method:  Local infiltration   Local anesthetic:  Lidocaine 1% w/o epi Laceration details:    Location:  Finger   Finger location:  R long finger   Length (cm):  1.5 Repair type:    Repair type:  Simple Pre-procedure details:    Preparation:  Patient was prepped and draped in usual sterile fashion Exploration:    Wound exploration: wound explored through full range of motion     Contaminated: no   Treatment:    Area cleansed with:  Betadine   Amount of cleaning:  Standard   Irrigation solution:  Sterile saline   Irrigation method:  Pressure wash   Visualized foreign bodies/material removed: no   Skin repair:    Repair method:  Sutures   Suture size:  4-0   Suture material:  Prolene   Suture technique:  Simple interrupted   Number of sutures:  4 Approximation:    Approximation:  Close   Vermilion border: well-aligned   Post-procedure details:    Dressing:  Non-adherent dressing   Patient tolerance of procedure:  Tolerated well, no immediate complications    (including critical care time)  Medications Ordered in ED Medications  lidocaine (PF) (XYLOCAINE) 1 % injection 5 mL (5 mLs Infiltration Given 08/09/16 2321)      Initial Impression / Assessment and Plan / ED Course  I have reviewed the triage vital signs and the nursing notes.    Tetanus UTD. Laceration occurred < 12 hours prior to repair. Discussed laceration care with pt and answered questions. Pt to f-u for suture removal in 7 days and wound check sooner should there be signs of dehiscence or infection. Pt is hemodynamically stable with no complaints prior to dc.    Final Clinical Impressions(s) / ED Diagnoses   Final diagnoses:  Laceration of right middle finger without foreign body without damage to nail, initial encounter    New Prescriptions Discharge Medication List as of  08/09/2016 11:48 PM    I personally performed the services described in this documentation, which was scribed in my presence. The recorded information has been reviewed and is accurate.     [id number]Rumaisa Schnetzer MarshallM Shannette Tabares, NP 08/11/16 [id number]    Tomasita CrumbleAdeleke Oni, MD 08/11/16 [phone]

## 2016-08-09 NOTE — ED Notes (Signed)
Suture cart at bedside 

## 2016-09-10 MED FILL — ACYCLOVIR 800 MG TABLET: 800 | 15 days supply | Qty: 30 | Fill #0

## 2016-10-07 MED FILL — TERBINAFINE HCL 250 MG TAB: 250 | 45 days supply | Qty: 45 | Fill #1

## 2016-11-15 DIAGNOSIS — N925 Other specified irregular menstruation: Secondary | ICD-10-CM | POA: Diagnosis not present

## 2016-11-20 ENCOUNTER — Other Ambulatory Visit: Payer: Self-pay | Admitting: Obstetrics and Gynecology

## 2016-11-20 DIAGNOSIS — N925 Other specified irregular menstruation: Secondary | ICD-10-CM | POA: Diagnosis not present

## 2016-11-20 DIAGNOSIS — Z6829 Body mass index (BMI) 29.0-29.9, adult: Secondary | ICD-10-CM | POA: Diagnosis not present

## 2016-11-20 DIAGNOSIS — R87612 Low grade squamous intraepithelial lesion on cytologic smear of cervix (LGSIL): Secondary | ICD-10-CM | POA: Diagnosis not present

## 2016-11-22 LAB — CYTOLOGY - PAP

## 2016-11-28 MED FILL — TARON-C DHA CAPSULE: 53.5-38-1 | 30 days supply | Qty: 30 | Fill #0

## 2016-11-29 DIAGNOSIS — Z3480 Encounter for supervision of other normal pregnancy, unspecified trimester: Secondary | ICD-10-CM | POA: Diagnosis not present

## 2016-11-29 DIAGNOSIS — O3680X1 Pregnancy with inconclusive fetal viability, fetus 1: Secondary | ICD-10-CM | POA: Diagnosis not present

## 2016-12-07 ENCOUNTER — Encounter (HOSPITAL_COMMUNITY): Payer: Self-pay | Admitting: *Deleted

## 2016-12-07 ENCOUNTER — Inpatient Hospital Stay (HOSPITAL_COMMUNITY): Payer: 59

## 2016-12-07 ENCOUNTER — Inpatient Hospital Stay (HOSPITAL_COMMUNITY)
Admission: AD | Admit: 2016-12-07 | Discharge: 2016-12-07 | Disposition: A | Discharge status: Home / Self Care | Payer: 59 | Source: Ambulatory Visit | Attending: Obstetrics & Gynecology | Admitting: Obstetrics & Gynecology

## 2016-12-07 DIAGNOSIS — O418X10 Other specified disorders of amniotic fluid and membranes, first trimester, not applicable or unspecified: Secondary | ICD-10-CM

## 2016-12-07 DIAGNOSIS — O208 Other hemorrhage in early pregnancy: Secondary | ICD-10-CM | POA: Diagnosis not present

## 2016-12-07 DIAGNOSIS — Z3A08 8 weeks gestation of pregnancy: Secondary | ICD-10-CM | POA: Diagnosis not present

## 2016-12-07 DIAGNOSIS — O468X1 Other antepartum hemorrhage, first trimester: Secondary | ICD-10-CM

## 2016-12-07 DIAGNOSIS — O209 Hemorrhage in early pregnancy, unspecified: Secondary | ICD-10-CM

## 2016-12-07 DIAGNOSIS — O200 Threatened abortion: Secondary | ICD-10-CM | POA: Diagnosis present

## 2016-12-07 LAB — CBC WITH DIFFERENTIAL/PLATELET
Basophils Absolute: 0.1 10*3/uL (ref 0.0–0.1)
Basophils Relative: 1 %
EOS ABS: 0.2 10*3/uL (ref 0.0–0.7)
Eosinophils Relative: 2 %
HCT: 36.7 % (ref 36.0–46.0)
Hemoglobin: 12.1 g/dL (ref 12.0–15.0)
LYMPHS ABS: 3.4 10*3/uL (ref 0.7–4.0)
LYMPHS PCT: 33 %
MCH: 31.2 pg (ref 26.0–34.0)
MCHC: 33 g/dL (ref 30.0–36.0)
MCV: 94.6 fL (ref 78.0–100.0)
MONOS PCT: 5 %
Monocytes Absolute: 0.5 10*3/uL (ref 0.1–1.0)
NEUTROS PCT: 59 %
Neutro Abs: 5.9 10*3/uL (ref 1.7–7.7)
Platelets: 393 10*3/uL (ref 150–400)
RBC: 3.88 MIL/uL (ref 3.87–5.11)
RDW: 14.2 % (ref 11.5–15.5)
WBC: 10.1 10*3/uL (ref 4.0–10.5)

## 2016-12-07 LAB — POCT PREGNANCY, URINE: PREG TEST UR: POSITIVE — AB

## 2016-12-07 NOTE — MAU Note (Signed)
Will send urine to lab.

## 2016-12-07 NOTE — Progress Notes (Signed)
Written and verbal d/c instructions given and understanding voiced. 

## 2016-12-07 NOTE — Discharge Instructions (Signed)
° °Pelvic Rest °Pelvic rest may be recommended if: °· Your placenta is partially or completely covering the opening of your cervix (placenta previa). °· There is bleeding between the wall of the uterus and the amniotic sac in the first trimester of pregnancy (subchorionic hemorrhage). °· You went into labor too early (preterm labor). °Based on your overall health and the health of your baby, your health care provider will decide if pelvic rest is right for you. °How do I rest my pelvis? °For as long as told by your health care provider: °· Do not have sex, sexual stimulation, or an orgasm. °· Do not use tampons. Do not douche. Do not put anything in your vagina. °· Do not lift anything that is heavier than 10 lb (4.5 kg). °· Avoid activities that take a lot of effort (are strenuous). °· Avoid any activity in which your pelvic muscles could become strained. °When should I seek medical care? °Seek medical care if you have: °· Cramping pain in your lower abdomen. °· Vaginal discharge. °· A low, dull backache. °· Regular contractions. °· Uterine tightening. °When should I seek immediate medical care? °Seek immediate medical care if: °· You have vaginal bleeding and you are pregnant. °This information is not intended to replace advice given to you by your health care provider. Make sure you discuss any questions you have with your health care provider. °Document Released: 11/02/2010 Document Revised: 12/14/2015 Document Reviewed: 01/09/2015 °Elsevier Interactive Patient Education © 2017 [employer] °Subchorionic Hematoma °A subchorionic hematoma is a gathering of blood between the outer wall of the placenta and the inner wall of the womb (uterus). The placenta is the organ that connects the fetus to the wall of the uterus. The placenta performs the feeding, breathing (oxygen to the fetus), and waste removal (excretory work) of the fetus. °Subchorionic hematoma is the most common abnormality found on a result from  ultrasonography done during the first trimester or early second trimester of pregnancy. If there has been little or no vaginal bleeding, early small hematomas usually shrink on their own and do not affect your baby or pregnancy. The blood is gradually absorbed over 1-2 weeks. When bleeding starts later in pregnancy or the hematoma is larger or occurs in an older pregnant woman, the outcome may not be as good. Larger hematomas may get bigger, which increases the chances for miscarriage. Subchorionic hematoma also increases the risk of premature detachment of the placenta from the uterus, preterm (premature) labor, and stillbirth. °Follow these instructions at home: °· Stay on bed rest if your health care provider recommends this. Although bed rest will not prevent more bleeding or prevent a miscarriage, your health care provider may recommend bed rest until you are advised otherwise. °· Avoid heavy lifting (more than 10 lb [4.5 kg]), exercise, sexual intercourse, or douching as directed by your health care provider. °· Keep track of the number of pads you use each day and how soaked (saturated) they are. Write down this information. °· Do not use tampons. °· Keep all follow-up appointments as directed by your health care provider. Your health care provider may ask you to have follow-up blood tests or ultrasound tests or both. °Get help right away if: °· You have severe cramps in your stomach, back, abdomen, or pelvis. °· You have a fever. °· You pass large clots or tissue. Save any tissue for your health care provider to look at. °· Your bleeding increases or you become lightheaded, feel weak, or have   fainting episodes. °This information is not intended to replace advice given to you by your health care provider. Make sure you discuss any questions you have with your health care provider. °Document Released: 10/23/2006 Document Revised: 12/14/2015 Document Reviewed: 02/04/2013 °Elsevier Interactive Patient Education  © 2017 Elsevier Inc. ° °

## 2016-12-07 NOTE — MAU Provider Note (Signed)
History     CSN: [id number]  Arrival date and time: 12/07/16 2126   First Provider Initiated Contact with Patient 12/07/16 2208      Chief Complaint  Patient presents with  . Threatened Miscarriage   HPI  [name] is a 41 y.o. [id number] at Unknown who presents to MAU today with complaint of vaginal bleeding. The patient states that she had 2 normal [place] in the office earlier in the pregnancy. She states that tonight after work she noted vaginal bleeding and passed something that she felt was possible POC. She denies pain or continued bleeding. She has had 2 previous miscarriages in the last year.   OB History    Gravida Para Term Preterm AB Living   4 1 1   2 1    SAB TAB Ectopic Multiple Live Births   2     0 1      Past Medical History:  Diagnosis Date  . Anemia    hx  . Headache    Hx - last one 05/2013  . SVD (spontaneous vaginal delivery)    x 2    Past Surgical History:  Procedure Laterality Date  . DILATION AND EVACUATION N/A 10/27/2015   Procedure: DILATATION AND EVACUATION;  Surgeon: Carrington Clamp, MD;  Location: [facility];  Service: Gynecology;  Laterality: N/A;  . DILATION AND EVACUATION N/A 07/12/2016   Procedure: DILATATION AND EVACUATION;  Surgeon: Carrington Clamp, MD;  Location: [facility];  Service: Gynecology;  Laterality: N/A;  . LASIK    . WISDOM TOOTH EXTRACTION      History reviewed. No pertinent family history.  Social History  Substance Use Topics  . Smoking status: Never Smoker  . Smokeless tobacco: Never Used  . Alcohol use No    Allergies: No Known Allergies  No prescriptions prior to admission.    Review of Systems  Constitutional: Negative for fever.  Gastrointestinal: Negative for abdominal pain, constipation, diarrhea, nausea and vomiting.  Genitourinary: Positive for vaginal bleeding. Negative for vaginal discharge.   Physical Exam   Blood pressure 122/78, pulse 88, temperature 98.8 F (37.1 C), resp. rate 18, height  5\' 4"  (1.626 m), weight 172 lb (78 kg), last menstrual period 10/06/2016, unknown if currently breastfeeding.  Physical Exam  Nursing note and vitals reviewed. Constitutional: She is oriented to person, place, and time. She appears well-developed and well-nourished. No distress.  HENT:  Head: Normocephalic and atraumatic.  Cardiovascular: Normal rate.   Respiratory: Effort normal.  GI: Soft. She exhibits no distension and no mass. There is no tenderness. There is no rebound and no guarding.  Genitourinary: Uterus is enlarged. Uterus is not tender. Cervix exhibits no motion tenderness, no discharge and no friability. Right adnexum displays no mass and no tenderness. Left adnexum displays no mass and no tenderness. There is bleeding (small) in the vagina. No vaginal discharge found.  Neurological: She is alert and oriented to person, place, and time.  Skin: Skin is warm and dry. No erythema.  Psychiatric: She has a normal mood and affect.    Results for orders placed or performed during the hospital encounter of 12/07/16 (from the past 24 hour(s))  CBC with Differential/Platelet     Status: None   Collection Time: 12/07/16  9:48 PM  Result Value Ref Range   WBC 10.1 4.0 - 10.5 K/uL   RBC 3.88 3.87 - 5.11 MIL/uL   Hemoglobin 12.1 12.0 - 15.0 g/dL   HCT 14.7 82.9 - 56.2 %  MCV 94.6 78.0 - 100.0 fL   MCH 31.2 26.0 - 34.0 pg   MCHC 33.0 30.0 - 36.0 g/dL   RDW 45.414.2 09.811.5 - 11.915.5 %   Platelets 393 150 - 400 K/uL   Neutrophils Relative % 59 %   Neutro Abs 5.9 1.7 - 7.7 K/uL   Lymphocytes Relative 33 %   Lymphs Abs 3.4 0.7 - 4.0 K/uL   Monocytes Relative 5 %   Monocytes Absolute 0.5 0.1 - 1.0 K/uL   Eosinophils Relative 2 %   Eosinophils Absolute 0.2 0.0 - 0.7 K/uL   Basophils Relative 1 %   Basophils Absolute 0.1 0.0 - 0.1 K/uL  Pregnancy, urine POC     Status: Abnormal   Collection Time: 12/07/16  9:58 PM  Result Value Ref Range   Preg Test, Ur POSITIVE (A) NEGATIVE   [place] Ob Comp  Less 14 Wks  Result Date: 12/07/2016 CLINICAL DATA:  Bleeding, history of miscarriage EXAM: OBSTETRIC <14 WK US AND TRANSVAGINAL OB US TECHNIQUE: Both transabdominal and transvaginal ultrasound examinations were performed for complete evaluation of the gestation as well as the maternal uterus, adnexal regions, and pelvic cul-de-sac. Transvaginal technique was performed to assess early pregnancy. COMPARISON:  None. FINDINGS: Intrauterine gestational sac: Single Yolk sac:  Visualized Embryo:  Visualize Cardiac Activity: Visualized Heart Rate: 167  bpm CRL:  22.8  mm   8 w   6 d                  US EDC: 07/13/2017 Subchorionic hemorrhage: Small posterior 12 x 5 mm focus of hemorrhage. Maternal uterus/adnexae: Corpus luteum on the right. Normal left ovary. No free fluid. IMPRESSION: Single live intrauterine 8 week 6 day gestation with ultrasound EDC of 07/13/2017. Small posterior focus of perigestational hemorrhage measuring 12 x 5 mm. Electronically Signed   By: Tollie Ethavid  Kwon M.D.   On: 12/07/2016 23:26   [place] Ob Transvaginal  Result Date: 12/07/2016 CLINICAL DATA:  Bleeding, history of miscarriage EXAM: OBSTETRIC <14 WK US AND TRANSVAGINAL OB US TECHNIQUE: Both transabdominal and transvaginal ultrasound examinations were performed for complete evaluation of the gestation as well as the maternal uterus, adnexal regions, and pelvic cul-de-sac. Transvaginal technique was performed to assess early pregnancy. COMPARISON:  None. FINDINGS: Intrauterine gestational sac: Single Yolk sac:  Visualized Embryo:  Visualize Cardiac Activity: Visualized Heart Rate: 167  bpm CRL:  22.8  mm   8 w   6 d                  US EDC: 07/13/2017 Subchorionic hemorrhage: Small posterior 12 x 5 mm focus of hemorrhage. Maternal uterus/adnexae: Corpus luteum on the right. Normal left ovary. No free fluid. IMPRESSION: Single live intrauterine 8 week 6 day gestation with ultrasound EDC of 07/13/2017. Small posterior focus of perigestational  hemorrhage measuring 12 x 5 mm. Electronically Signed   By: Tollie Ethavid  Kwon M.D.   On: 12/07/2016 23:26    MAU Course  Procedures None  MDM +UPT CBC and US today  Discussed results with Dr. Mora ApplPinn. OK for discharge with pelvic rest and follow-up with Dr. Henderson CloudHorvath this week Assessment and Plan  A: SIUP at [id number] Small subchorionic hemorrhage Vaginal bleeding in pregnancy, first trimester   P: Discharge home Bleeding precautions and pelvic rest discussed Patient [facility] Patient may return to MAU as needed or if her condition were to change or worsen   Vonzella NippleJulie Moraima Burd, PA-C 12/08/2016,  1:52 AM

## 2016-12-07 NOTE — MAU Note (Signed)
Just had a miscarriage and passed POC at 2025. No pain. Had just gotten off work and eating dinner and felt wet and saw the POC.

## 2016-12-10 DIAGNOSIS — O3680X1 Pregnancy with inconclusive fetal viability, fetus 1: Secondary | ICD-10-CM | POA: Diagnosis not present

## 2016-12-17 DIAGNOSIS — Z3480 Encounter for supervision of other normal pregnancy, unspecified trimester: Secondary | ICD-10-CM | POA: Diagnosis not present

## 2016-12-31 MED FILL — TARON-C DHA CAPSULE: 53.5-38-1 | 30 days supply | Qty: 30 | Fill #1

## 2017-01-01 ENCOUNTER — Inpatient Hospital Stay (HOSPITAL_COMMUNITY): Payer: 59 | Admitting: Anesthesiology

## 2017-01-01 ENCOUNTER — Ambulatory Visit (HOSPITAL_COMMUNITY)
Admission: AD | Admit: 2017-01-01 | Discharge: 2017-01-01 | Disposition: A | Discharge status: Home / Self Care | Payer: 59 | Source: Ambulatory Visit | Attending: Obstetrics and Gynecology | Admitting: Obstetrics and Gynecology

## 2017-01-01 ENCOUNTER — Encounter (HOSPITAL_COMMUNITY): Payer: Self-pay | Admitting: *Deleted

## 2017-01-01 ENCOUNTER — Encounter (HOSPITAL_COMMUNITY)
Admission: AD | Disposition: A | Discharge status: Home / Self Care | Payer: Self-pay | Source: Ambulatory Visit | Attending: Obstetrics and Gynecology

## 2017-01-01 ENCOUNTER — Other Ambulatory Visit (HOSPITAL_COMMUNITY): Payer: Self-pay | Admitting: Obstetrics and Gynecology

## 2017-01-01 DIAGNOSIS — O3680X1 Pregnancy with inconclusive fetal viability, fetus 1: Secondary | ICD-10-CM | POA: Diagnosis not present

## 2017-01-01 DIAGNOSIS — O021 Missed abortion: Secondary | ICD-10-CM | POA: Insufficient documentation

## 2017-01-01 DIAGNOSIS — O3680X0 Pregnancy with inconclusive fetal viability, not applicable or unspecified: Secondary | ICD-10-CM | POA: Diagnosis not present

## 2017-01-01 DIAGNOSIS — Z79899 Other long term (current) drug therapy: Secondary | ICD-10-CM | POA: Diagnosis not present

## 2017-01-01 HISTORY — PX: DILATION AND EVACUATION: SHX1459

## 2017-01-01 LAB — CBC
HCT: 34.8 % — ABNORMAL LOW (ref 36.0–46.0)
HEMOGLOBIN: 11.7 g/dL — AB (ref 12.0–15.0)
MCH: 31.5 pg (ref 26.0–34.0)
MCHC: 33.6 g/dL (ref 30.0–36.0)
MCV: 93.5 fL (ref 78.0–100.0)
PLATELETS: 362 10*3/uL (ref 150–400)
RBC: 3.72 MIL/uL — AB (ref 3.87–5.11)
RDW: 13.8 % (ref 11.5–15.5)
WBC: 8.1 10*3/uL (ref 4.0–10.5)

## 2017-01-01 SURGERY — DILATION AND EVACUATION, UTERUS
Anesthesia: General | Site: Vagina

## 2017-01-01 MED ORDER — FENTANYL CITRATE (PF) 100 MCG/2ML IJ SOLN
INTRAMUSCULAR | Status: DC | PRN
  Administered 2017-01-01: 50 ug via INTRAVENOUS
  Administered 2017-01-01: 100 ug via INTRAVENOUS

## 2017-01-01 MED ORDER — ONDANSETRON HCL 4 MG/2ML IJ SOLN
INTRAMUSCULAR | Status: DC | PRN
  Administered 2017-01-01: 4 mg via INTRAVENOUS

## 2017-01-01 MED ORDER — METHYLERGONOVINE MALEATE 0.2 MG/ML IJ SOLN
INTRAMUSCULAR | Status: DC | PRN
  Administered 2017-01-01: 0.2 mg via INTRAMUSCULAR

## 2017-01-01 MED ORDER — FENTANYL CITRATE (PF) 100 MCG/2ML IJ SOLN: 25.0000 ug | INTRAMUSCULAR | Status: DC | PRN

## 2017-01-01 MED ORDER — DEXAMETHASONE SODIUM PHOSPHATE 4 MG/ML IJ SOLN
INTRAMUSCULAR | Status: DC | PRN
  Administered 2017-01-01: 4 mg via INTRAVENOUS

## 2017-01-01 MED ORDER — PROPOFOL 10 MG/ML IV BOLUS
INTRAVENOUS | Status: DC | PRN
  Administered 2017-01-01: 200 mg via INTRAVENOUS

## 2017-01-01 MED ORDER — LIDOCAINE HCL (CARDIAC) 20 MG/ML IV SOLN
INTRAVENOUS | Status: DC | PRN
  Administered 2017-01-01: 80 mg via INTRAVENOUS

## 2017-01-01 MED ORDER — SCOPOLAMINE 1 MG/3DAYS TD PT72
MEDICATED_PATCH | TRANSDERMAL | Status: DC | PRN
  Administered 2017-01-01: 1 via TRANSDERMAL

## 2017-01-01 MED ORDER — LACTATED RINGERS IV SOLN
INTRAVENOUS | Status: DC | PRN
  Administered 2017-01-01: 19:00:00 via INTRAVENOUS

## 2017-01-01 MED ORDER — FAMOTIDINE IN NACL 20-0.9 MG/50ML-% IV SOLN
20.0000 mg | Freq: Once | INTRAVENOUS | Status: DC
  Filled 2017-01-01: qty 50

## 2017-01-01 MED ORDER — MEPERIDINE HCL 25 MG/ML IJ SOLN: 6.2500 mg | INTRAMUSCULAR | Status: DC | PRN

## 2017-01-01 MED ORDER — OXYCODONE HCL 5 MG/5ML PO SOLN: 5.0000 mg | Freq: Once | ORAL | Status: DC | PRN

## 2017-01-01 MED ORDER — ONDANSETRON HCL 4 MG/2ML IJ SOLN
INTRAMUSCULAR | Status: AC
  Filled 2017-01-01: qty 2

## 2017-01-01 MED ORDER — LIDOCAINE HCL (CARDIAC) 20 MG/ML IV SOLN
INTRAVENOUS | Status: AC
  Filled 2017-01-01: qty 5

## 2017-01-01 MED ORDER — MIDAZOLAM HCL 2 MG/2ML IJ SOLN
INTRAMUSCULAR | Status: DC | PRN
  Administered 2017-01-01: 2 mg via INTRAVENOUS

## 2017-01-01 MED ORDER — MIDAZOLAM HCL 2 MG/2ML IJ SOLN
INTRAMUSCULAR | Status: AC
  Filled 2017-01-01: qty 2

## 2017-01-01 MED ORDER — FENTANYL CITRATE (PF) 100 MCG/2ML IJ SOLN
INTRAMUSCULAR | Status: AC
  Filled 2017-01-01: qty 2

## 2017-01-01 MED ORDER — KETOROLAC TROMETHAMINE 30 MG/ML IJ SOLN
INTRAMUSCULAR | Status: DC | PRN
  Administered 2017-01-01: 30 mg via INTRAVENOUS

## 2017-01-01 MED ORDER — OXYCODONE HCL 5 MG PO TABS: 5.0000 mg | ORAL_TABLET | Freq: Once | ORAL | Status: DC | PRN

## 2017-01-01 MED ORDER — DEXAMETHASONE SODIUM PHOSPHATE 4 MG/ML IJ SOLN
INTRAMUSCULAR | Status: AC
  Filled 2017-01-01: qty 1

## 2017-01-01 MED ORDER — LACTATED RINGERS IV SOLN
INTRAVENOUS | Status: DC
  Administered 2017-01-01: 19:00:00 via INTRAVENOUS

## 2017-01-01 MED ORDER — PROPOFOL 10 MG/ML IV BOLUS
INTRAVENOUS | Status: AC
  Filled 2017-01-01: qty 20

## 2017-01-01 MED ORDER — PROMETHAZINE HCL 25 MG/ML IJ SOLN: 6.2500 mg | INTRAMUSCULAR | Status: DC | PRN

## 2017-01-01 MED ORDER — SOD CITRATE-CITRIC ACID 500-334 MG/5ML PO SOLN
30.0000 mL | Freq: Once | ORAL | Status: AC
  Administered 2017-01-01: 30 mL via ORAL
  Filled 2017-01-01: qty 15

## 2017-01-01 SURGICAL SUPPLY — 19 items
CATH ROBINSON RED A/P 16FR (CATHETERS) ×3 IMPLANT
CLOTH BEACON ORANGE TIMEOUT ST (SAFETY) ×3 IMPLANT
DECANTER SPIKE VIAL GLASS SM (MISCELLANEOUS) ×3 IMPLANT
GLOVE BIO SURGEON STRL SZ7 (GLOVE) ×3 IMPLANT
GLOVE BIOGEL PI IND STRL 7.0 (GLOVE) ×2 IMPLANT
GLOVE BIOGEL PI INDICATOR 7.0 (GLOVE) ×4
GOWN STRL REUS W/TWL LRG LVL3 (GOWN DISPOSABLE) ×6 IMPLANT
KIT BERKELEY 1ST TRIMESTER 3/8 (MISCELLANEOUS) ×5 IMPLANT
NS IRRIG 1000ML POUR BTL (IV SOLUTION) ×3 IMPLANT
PACK VAGINAL MINOR WOMEN LF (CUSTOM PROCEDURE TRAY) ×3 IMPLANT
PAD OB MATERNITY 4.3X12.25 (PERSONAL CARE ITEMS) ×3 IMPLANT
PAD PREP 24X48 CUFFED NSTRL (MISCELLANEOUS) ×3 IMPLANT
SET BERKELEY SUCTION TUBING (SUCTIONS) ×3 IMPLANT
TOWEL OR 17X24 6PK STRL BLUE (TOWEL DISPOSABLE) ×6 IMPLANT
VACURETTE 10 RIGID CVD (CANNULA) IMPLANT
VACURETTE 12 RIGID CVD (CANNULA) ×2 IMPLANT
VACURETTE 7MM CVD STRL WRAP (CANNULA) IMPLANT
VACURETTE 8 RIGID CVD (CANNULA) IMPLANT
VACURETTE 9 RIGID CVD (CANNULA) IMPLANT

## 2017-01-01 NOTE — Transfer of Care (Signed)
Immediate Anesthesia Transfer of Care Note  Patient: Brittany Frey  Procedure(s) Performed: Procedure(s): DILATATION AND EVACUATION (N/A)  Patient Location: PACU  Anesthesia Type:General  Level of Consciousness: awake, alert  and oriented  Airway & Oxygen Therapy: Patient Spontanous Breathing and Patient connected to nasal cannula oxygen  Post-op Assessment: Report given to RN and Post -op Vital signs reviewed and stable  Post vital signs: Reviewed and stable BP 104/76, HR 96, RR 18, SaO2 100%  Last Vitals: There were no vitals filed for this visit.  Last Pain: There were no vitals filed for this visit.       Complications: No apparent anesthesia complications

## 2017-01-01 NOTE — Op Note (Signed)
01/01/2017  7:44 PM  PATIENT:  Brittany Frey  40 y.o. female  PRE-OPERATIVE DIAGNOSIS:  Missed Abortion  POST-OPERATIVE DIAGNOSIS:  Missed Abortion  PROCEDURE:  Procedure(s): DILATATION AND EVACUATION (N/A)  SURGEON:  Surgeon(s) and Role:    * Adja Ruff, MD - Primary   ANESTHESIA:   general  EBL:  Total I/O In: -  Out: 150 [Blood:150]  SPECIMEN:  Source of Specimen:  products of conception  DISPOSITION OF SPECIMEN:  PATHOLOGY  COUNTS:  YES  TOURNIQUET:  * No tourniquets in log *  DICTATION: .Note written in EPIC  PLAN OF CARE: Discharge to home after PACU  PATIENT DISPOSITION:  PACU - hemodynamically stable.   Delay start of Pharmacological VTE agent (>24hrs) due to surgical blood loss or risk of bleeding: not applicable  Medications: Methergine  Complications: None  Findings:  12 week size uterus to 9 size post procedure.  Good crie was achieved.  After adequate anesthesia was achieved, the patient was prepped and draped in the usual sterile fashion.  The speculum was placed in the vagina and the cervix stabilized with a single-tooth tenaculum.  The cervix was dilated with Pratt dilators and the 12 mm curette was used to remove contents of the uterus.  Alternating sharp curettage with a curette and suction curettage was performed until all contents were removed and good crie was achieved.  All instruments were removed from the vagina.  The patient tolerated the procedure well.    Wayde Gopaul A      

## 2017-01-01 NOTE — Brief Op Note (Signed)
01/01/2017  7:44 PM  PATIENT:  Brittany Frey  41 y.o. female  PRE-OPERATIVE DIAGNOSIS:  Missed Abortion  POST-OPERATIVE DIAGNOSIS:  Missed Abortion  PROCEDURE:  Procedure(s): DILATATION AND EVACUATION (N/A)  SURGEON:  Surgeon(s) and Role:    * Carrington ClampHorvath, Michale Emmerich, MD - Primary   ANESTHESIA:   general  EBL:  Total I/O In: -  Out: 150 [Blood:150]  SPECIMEN:  Source of Specimen:  products of conception  DISPOSITION OF SPECIMEN:  PATHOLOGY  COUNTS:  YES  TOURNIQUET:  * No tourniquets in log *  DICTATION: .Note written in EPIC  PLAN OF CARE: Discharge to home after PACU  PATIENT DISPOSITION:  PACU - hemodynamically stable.   Delay start of Pharmacological VTE agent (>24hrs) due to surgical blood loss or risk of bleeding: not applicable  Medications: Methergine  Complications: None  Findings:  12 week size uterus to 9 size post procedure.  Good crie was achieved.  After adequate anesthesia was achieved, the patient was prepped and draped in the usual sterile fashion.  The speculum was placed in the vagina and the cervix stabilized with a single-tooth tenaculum.  The cervix was dilated with Shawnie PonsPratt dilators and the 12 mm curette was used to remove contents of the uterus.  Alternating sharp curettage with a curette and suction curettage was performed until all contents were removed and good crie was achieved.  All instruments were removed from the vagina.  The patient tolerated the procedure well.    Derrian Rodak A

## 2017-01-01 NOTE — Anesthesia Preprocedure Evaluation (Addendum)
Anesthesia Evaluation  Patient identified by MRN, date of birth, ID band Patient awake    Reviewed: Allergy & Precautions, NPO status , Patient's Chart, lab work & pertinent test results  History of Anesthesia Complications Negative for: history of anesthetic complications  Airway Mallampati: II  TM Distance: >3 FB Neck ROM: Full    Dental  (+) Dental Advisory Given   Pulmonary neg pulmonary ROS,    breath sounds clear to auscultation       Cardiovascular negative cardio ROS   Rhythm:Regular Rate:Normal     Neuro/Psych negative neurological ROS     GI/Hepatic negative GI ROS, Neg liver ROS,   Endo/Other  Morbid obesity  Renal/GU negative Renal ROS     Musculoskeletal   Abdominal (+) - obese,   Peds  Hematology plt 342k   Anesthesia Other Findings   Reproductive/Obstetrics (+) Pregnancy Missed Ab                            Anesthesia Physical  Anesthesia Plan  ASA: I  Anesthesia Plan: General   Post-op Pain Management:    Induction: Intravenous  PONV Risk Score and Plan: 3 and Ondansetron, Dexamethasone, Propofol and Midazolam  Airway Management Planned: LMA  Additional Equipment: None  Intra-op Plan:   Post-operative Plan: Extubation in OR  Informed Consent: I have reviewed the patients History and Physical, chart, labs and discussed the procedure including the risks, benefits and alternatives for the proposed anesthesia with the patient or authorized representative who has indicated his/her understanding and acceptance.   Dental advisory given  Plan Discussed with: CRNA  Anesthesia Plan Comments:       Anesthesia Quick Evaluation

## 2017-01-01 NOTE — Discharge Instructions (Signed)
°  Post Anesthesia Home Care Instructions  Activity: Get plenty of rest for the remainder of the day. A responsible individual must stay with you for 24 hours following the procedure.  For the next 24 hours, DO NOT: -Drive a car -Advertising copywriterperate machinery -Drink alcoholic beverages -Take any medication unless instructed by your physician -Make any legal decisions or sign important papers.  Meals: Start with liquid foods such as gelatin or soup. Progress to regular foods as tolerated. Avoid greasy, spicy, heavy foods. If nausea and/or vomiting occur, drink only clear liquids until the nausea and/or vomiting subsides. Call your physician if vomiting continues.  Special Instructions/Symptoms: Your throat may feel dry or sore from the anesthesia or the breathing tube placed in your throat during surgery. If this causes discomfort, gargle with warm salt water. The discomfort should disappear within 24 hours.  If you had a scopolamine patch placed behind your ear for the management of post- operative nausea and/or vomiting:  1. The medication in the patch is effective for 72 hours, after which it should be removed.  Wrap patch in a tissue and discard in the trash. Wash hands thoroughly with soap and water. 2. You may remove the patch earlier than 72 hours if you experience unpleasant side effects which may include dry mouth, dizziness or visual disturbances. 3. Avoid touching the patch. Wash your hands with soap and water after contact with the patch.    Next dose of Ibuprofen due at 1:45 am if needed for mild pain.  Sanitary pads only for vaginal drainage, not tampons.  You may shower tomorrow, Thursday.  No tub baths x 2 weeks.  Call MD for excessive bleeding , saturating and changing 1 pad every hour. Pain not relieved by over the counter medication. Fever 100.4 or greater. Unusual vaginal discharge or odor.

## 2017-01-01 NOTE — Progress Notes (Signed)
41 y.o. G5 P2032 at 12 weeks with MAB.  Past Medical History:  Diagnosis Date  . Anemia    hx  . Headache    Hx - last one 05/2013  . SVD (spontaneous vaginal delivery)    x 2   Past Surgical History:  Procedure Laterality Date  . DILATION AND EVACUATION N/A 10/27/2015   Procedure: DILATATION AND EVACUATION;  Surgeon: Carrington ClampMichelle Raeshaun Simson, MD;  Location: [facility];  Service: Gynecology;  Laterality: N/A;  . DILATION AND EVACUATION N/A 07/12/2016   Procedure: DILATATION AND EVACUATION;  Surgeon: Carrington ClampMichelle Shaddai Shapley, MD;  Location: [facility];  Service: Gynecology;  Laterality: N/A;  . LASIK    . WISDOM TOOTH EXTRACTION      Social History   Social History  . Marital status: Married    Spouse name: N/A  . Number of children: N/A  . Years of education: N/A   Occupational History  . Not on file.   Social History Main Topics  . Smoking status: Never Smoker  . Smokeless tobacco: Never Used  . Alcohol use No  . Drug use: No  . Sexual activity: Yes    Birth control/ protection: None     Comment: approx [redacted] wks gestation   Other Topics Concern  . Not on file   Social History Narrative  . No narrative on file    Current Outpatient Prescriptions on File Prior to Visit  Medication Sig Dispense Refill  . acetaminophen (TYLENOL) 500 MG tablet Take 1,000 mg by mouth every 6 (six) hours as needed for mild pain or headache.    Marland Kitchen. acyclovir (ZOVIRAX) 800 MG tablet Take 1 tablet by mouth daily as needed. For coldsores    . folic acid (FOLVITE) 400 MCG tablet Take 400 mcg by mouth daily.    . Prenatal Vit-Fe Fumarate-FA (PRENATAL MULTIVITAMIN) TABS tablet Take 1 tablet by mouth daily at 12 noon.    . progesterone 200 MG SUPP Place 200 mg vaginally at bedtime.     No current facility-administered medications on file prior to visit.     No Known Allergies  There were no vitals filed for this visit.  Lungs: clear to ascultation Cor:  RRR Abdomen:  soft, nontender, nondistended.  US no FHTS at  12 weeks Ex:  no cords, erythema Pelvic:  12 weeks, cervix closed  A:  For D&E for MAB   P: All risks, benefits and alternatives d/w patient and she desires to proceed.  Telly Broberg A

## 2017-01-01 NOTE — Anesthesia Procedure Notes (Signed)
Procedure Name: LMA Insertion Date/Time: 01/01/2017 7:28 PM Performed by: Elbert EwingsHYMER, Ava Deguire S Pre-anesthesia Checklist: Patient identified, Emergency Drugs available, Suction available, Patient being monitored and Timeout performed Patient Re-evaluated:Patient Re-evaluated prior to inductionOxygen Delivery Method: Circle system utilized Preoxygenation: Pre-oxygenation with 100% oxygen Intubation Type: IV induction LMA Size: 4.0 Placement Confirmation: positive ETCO2 and breath sounds checked- equal and bilateral Tube secured with: Tape

## 2017-01-02 ENCOUNTER — Encounter (HOSPITAL_COMMUNITY): Payer: Self-pay | Admitting: Obstetrics and Gynecology

## 2017-01-02 NOTE — Anesthesia Postprocedure Evaluation (Signed)
Anesthesia Post Note  Patient: Brittany Frey  Procedure(s) Performed: Procedure(s) (LRB): DILATATION AND EVACUATION (N/A)     Patient location during evaluation: PACU Anesthesia Type: General Level of consciousness: sedated and patient cooperative Pain management: pain level controlled Vital Signs Assessment: post-procedure vital signs reviewed and stable Respiratory status: spontaneous breathing Cardiovascular status: stable Anesthetic complications: no    Last Vitals:  Vitals:   01/01/17 2040 01/01/17 2115  BP: 112/68 124/63  Pulse: 84 77  Resp: 16 16  Temp:  37 C    Last Pain:  Vitals:   01/01/17 2115  PainSc: 4    Pain Goal: Patients Stated Pain Goal: 7 (01/01/17 2115)               Lewie LoronJohn Gryphon Vanderveen

## 2017-01-14 DIAGNOSIS — R102 Pelvic and perineal pain: Secondary | ICD-10-CM | POA: Diagnosis not present

## 2017-01-14 MED FILL — NITROFURANTOIN MONO-MCR 100: 100 | 7 days supply | Qty: 14 | Fill #0

## 2017-01-20 DIAGNOSIS — N96 Recurrent pregnancy loss: Secondary | ICD-10-CM | POA: Diagnosis not present

## 2017-01-23 DIAGNOSIS — N96 Recurrent pregnancy loss: Secondary | ICD-10-CM | POA: Diagnosis not present

## 2017-01-24 MED FILL — DOXYCYCLINE HYCLATE 100 MG: 100 | 14 days supply | Qty: 28 | Fill #0

## 2017-01-28 DIAGNOSIS — N96 Recurrent pregnancy loss: Secondary | ICD-10-CM | POA: Diagnosis not present

## 2017-02-06 DIAGNOSIS — N96 Recurrent pregnancy loss: Secondary | ICD-10-CM | POA: Diagnosis not present

## 2017-02-13 DIAGNOSIS — N96 Recurrent pregnancy loss: Secondary | ICD-10-CM | POA: Diagnosis not present

## 2017-02-17 DIAGNOSIS — N96 Recurrent pregnancy loss: Secondary | ICD-10-CM | POA: Diagnosis not present

## 2017-02-20 DIAGNOSIS — N96 Recurrent pregnancy loss: Secondary | ICD-10-CM | POA: Diagnosis not present

## 2017-03-05 MED FILL — TARON-C DHA CAPSULE: 53.5-38-1 | 30 days supply | Qty: 30 | Fill #2

## 2017-03-06 DIAGNOSIS — N96 Recurrent pregnancy loss: Secondary | ICD-10-CM | POA: Diagnosis not present

## 2017-04-28 MED FILL — CLOMIPHENE CITRATE 50 MG TA: 50 | 5 days supply | Qty: 10 | Fill #0

## 2017-07-25 DIAGNOSIS — N96 Recurrent pregnancy loss: Secondary | ICD-10-CM | POA: Diagnosis not present

## 2017-07-29 DIAGNOSIS — N979 Female infertility, unspecified: Secondary | ICD-10-CM | POA: Diagnosis not present

## 2017-08-05 DIAGNOSIS — N979 Female infertility, unspecified: Secondary | ICD-10-CM | POA: Diagnosis not present

## 2017-08-12 DIAGNOSIS — N979 Female infertility, unspecified: Secondary | ICD-10-CM | POA: Diagnosis not present

## 2017-08-14 DIAGNOSIS — N96 Recurrent pregnancy loss: Secondary | ICD-10-CM | POA: Diagnosis not present

## 2017-08-21 DIAGNOSIS — Z0100 Encounter for examination of eyes and vision without abnormal findings: Secondary | ICD-10-CM | POA: Diagnosis not present

## 2017-08-26 DIAGNOSIS — N96 Recurrent pregnancy loss: Secondary | ICD-10-CM | POA: Diagnosis not present

## 2017-09-02 DIAGNOSIS — Z1151 Encounter for screening for human papillomavirus (HPV): Secondary | ICD-10-CM | POA: Diagnosis not present

## 2017-09-02 DIAGNOSIS — Z6831 Body mass index (BMI) 31.0-31.9, adult: Secondary | ICD-10-CM | POA: Diagnosis not present

## 2017-09-02 DIAGNOSIS — Z01419 Encounter for gynecological examination (general) (routine) without abnormal findings: Secondary | ICD-10-CM | POA: Diagnosis not present

## 2017-09-02 MED FILL — ACYCLOVIR 800 MG TABLET: 800 | 5 days supply | Qty: 10 | Fill #0

## 2017-09-07 DIAGNOSIS — N96 Recurrent pregnancy loss: Secondary | ICD-10-CM | POA: Diagnosis not present

## 2017-09-16 DIAGNOSIS — N96 Recurrent pregnancy loss: Secondary | ICD-10-CM | POA: Diagnosis not present

## 2017-09-22 DIAGNOSIS — R87610 Atypical squamous cells of undetermined significance on cytologic smear of cervix (ASC-US): Secondary | ICD-10-CM | POA: Diagnosis not present

## 2017-09-25 MED FILL — DOXYCYCLINE HYCLATE 100 MG: 100 | 7 days supply | Qty: 14 | Fill #0

## 2017-09-26 DIAGNOSIS — E569 Vitamin deficiency, unspecified: Secondary | ICD-10-CM | POA: Diagnosis not present

## 2017-09-26 DIAGNOSIS — R5383 Other fatigue: Secondary | ICD-10-CM | POA: Diagnosis not present

## 2017-09-26 DIAGNOSIS — N943 Premenstrual tension syndrome: Secondary | ICD-10-CM | POA: Diagnosis not present

## 2017-09-26 DIAGNOSIS — E039 Hypothyroidism, unspecified: Secondary | ICD-10-CM | POA: Diagnosis not present

## 2017-09-26 DIAGNOSIS — N979 Female infertility, unspecified: Secondary | ICD-10-CM | POA: Diagnosis not present

## 2017-09-26 DIAGNOSIS — Z131 Encounter for screening for diabetes mellitus: Secondary | ICD-10-CM | POA: Diagnosis not present

## 2017-09-26 DIAGNOSIS — F439 Reaction to severe stress, unspecified: Secondary | ICD-10-CM | POA: Diagnosis not present

## 2017-10-31 DIAGNOSIS — E039 Hypothyroidism, unspecified: Secondary | ICD-10-CM | POA: Diagnosis not present

## 2017-10-31 DIAGNOSIS — R5383 Other fatigue: Secondary | ICD-10-CM | POA: Diagnosis not present

## 2017-10-31 DIAGNOSIS — N943 Premenstrual tension syndrome: Secondary | ICD-10-CM | POA: Diagnosis not present

## 2017-10-31 DIAGNOSIS — Z131 Encounter for screening for diabetes mellitus: Secondary | ICD-10-CM | POA: Diagnosis not present

## 2017-10-31 DIAGNOSIS — E569 Vitamin deficiency, unspecified: Secondary | ICD-10-CM | POA: Diagnosis not present

## 2017-10-31 DIAGNOSIS — F439 Reaction to severe stress, unspecified: Secondary | ICD-10-CM | POA: Diagnosis not present

## 2017-10-31 DIAGNOSIS — N979 Female infertility, unspecified: Secondary | ICD-10-CM | POA: Diagnosis not present

## 2017-10-31 MED FILL — ARMOUR THYROID 60 MG TABLET: 60 | 30 days supply | Qty: 30 | Fill #0

## 2017-11-11 MED FILL — PROGESTERONE 100 MG CAPSULE: 100 | 30 days supply | Qty: 30 | Fill #0

## 2017-11-12 MED FILL — ACYCLOVIR 800 MG TABLET: 800 | 5 days supply | Qty: 10 | Fill #1

## 2017-12-18 MED FILL — PROGESTERONE 100 MG CAPSULE: 100 | 30 days supply | Qty: 30 | Fill #1

## 2017-12-18 MED FILL — CLOMIPHENE CITRATE 50 MG TA: 50 | 5 days supply | Qty: 10 | Fill #1

## 2018-06-15 MED FILL — ACYCLOVIR 800 MG TABLET: 800 | 5 days supply | Qty: 10 | Fill #2

## 2018-06-30 DIAGNOSIS — L689 Hypertrichosis, unspecified: Secondary | ICD-10-CM | POA: Diagnosis not present

## 2018-06-30 DIAGNOSIS — L709 Acne, unspecified: Secondary | ICD-10-CM | POA: Diagnosis not present

## 2018-06-30 DIAGNOSIS — N96 Recurrent pregnancy loss: Secondary | ICD-10-CM | POA: Diagnosis not present

## 2018-06-30 DIAGNOSIS — Z1329 Encounter for screening for other suspected endocrine disorder: Secondary | ICD-10-CM | POA: Diagnosis not present

## 2018-07-14 DIAGNOSIS — N96 Recurrent pregnancy loss: Secondary | ICD-10-CM | POA: Diagnosis not present

## 2018-07-17 DIAGNOSIS — N96 Recurrent pregnancy loss: Secondary | ICD-10-CM | POA: Diagnosis not present

## 2018-07-20 DIAGNOSIS — N96 Recurrent pregnancy loss: Secondary | ICD-10-CM | POA: Diagnosis not present

## 2018-07-20 DIAGNOSIS — Z1329 Encounter for screening for other suspected endocrine disorder: Secondary | ICD-10-CM | POA: Diagnosis not present

## 2018-07-21 DIAGNOSIS — L709 Acne, unspecified: Secondary | ICD-10-CM | POA: Diagnosis not present

## 2018-07-21 DIAGNOSIS — N96 Recurrent pregnancy loss: Secondary | ICD-10-CM | POA: Diagnosis not present

## 2018-07-24 DIAGNOSIS — N96 Recurrent pregnancy loss: Secondary | ICD-10-CM | POA: Diagnosis not present

## 2018-08-21 DIAGNOSIS — Z01419 Encounter for gynecological examination (general) (routine) without abnormal findings: Secondary | ICD-10-CM | POA: Diagnosis not present

## 2018-08-21 DIAGNOSIS — N96 Recurrent pregnancy loss: Secondary | ICD-10-CM | POA: Diagnosis not present

## 2018-08-24 MED FILL — ACYCLOVIR 800 MG TABLET: 800 | 5 days supply | Qty: 10 | Fill #3

## 2018-08-26 DIAGNOSIS — Z1231 Encounter for screening mammogram for malignant neoplasm of breast: Secondary | ICD-10-CM | POA: Diagnosis not present

## 2018-09-07 DIAGNOSIS — N96 Recurrent pregnancy loss: Secondary | ICD-10-CM | POA: Diagnosis not present

## 2018-09-11 DIAGNOSIS — N96 Recurrent pregnancy loss: Secondary | ICD-10-CM | POA: Diagnosis not present

## 2019-03-16 IMAGING — US US OB COMP LESS 14 WK
1 series · 15 of 16 positions shown · non-contrast
Comparison: None.

CLINICAL DATA: Bleeding, history of miscarriage

EXAM:
OBSTETRIC <14 WK US AND TRANSVAGINAL OB US
TECHNIQUE: Both transabdominal and transvaginal ultrasound examinations were
performed for complete evaluation of the gestation as well as the
maternal uterus, adnexal regions, and pelvic cul-de-sac.
Transvaginal technique was performed to assess early pregnancy.

[Series 1: us ob comp less 14 wk · 16 acquisitions, 15 frames shown]
[im 1/16]
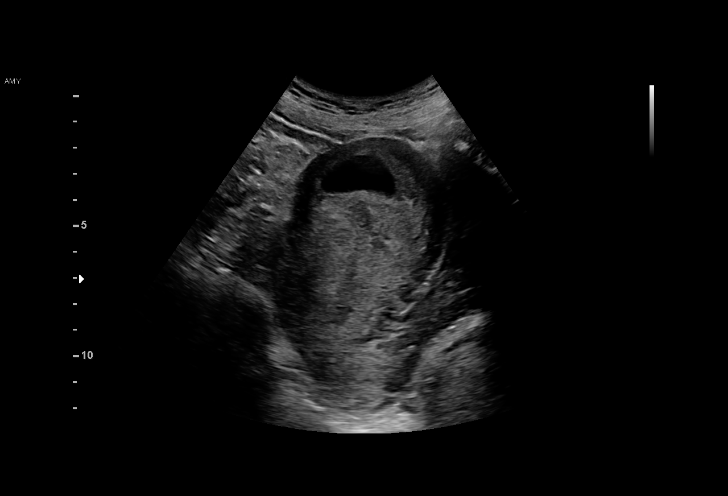
[im 2/16]
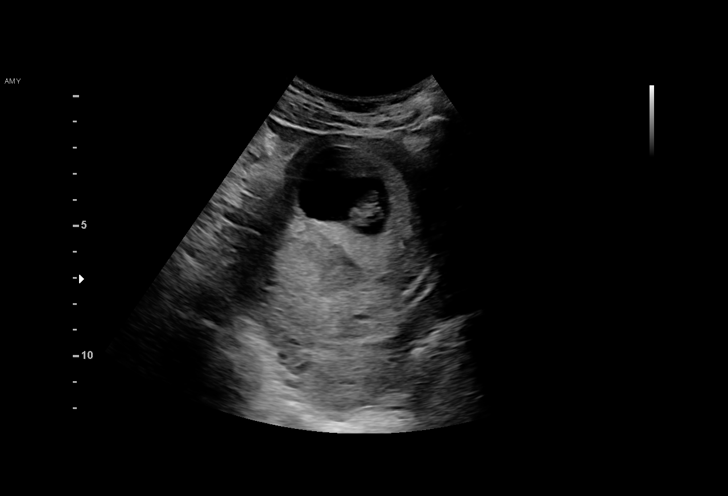
[im 3/16]
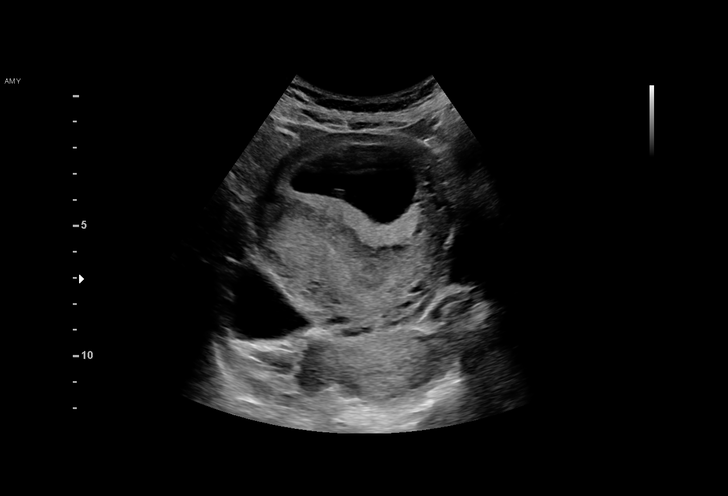
[im 4/16]
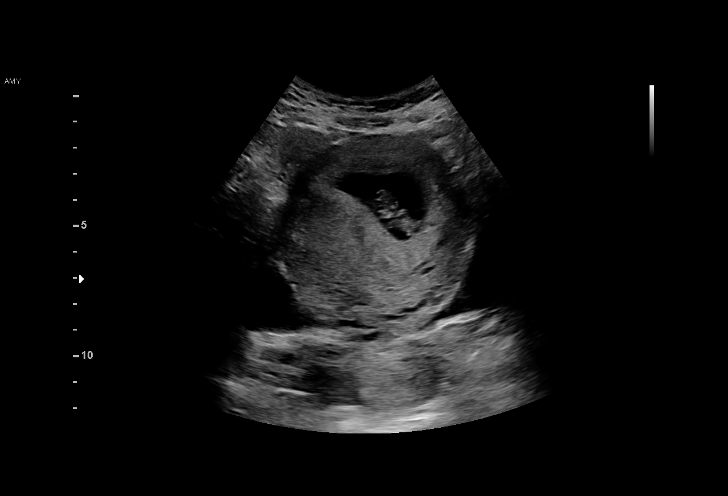
[im 5/16]
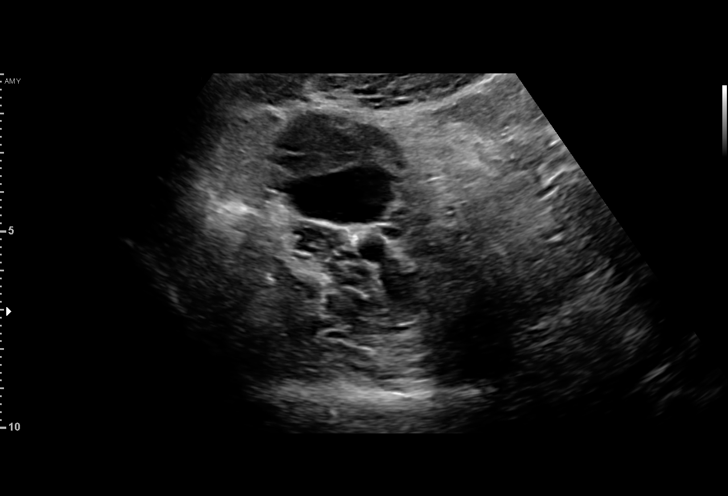
[im 6/16]
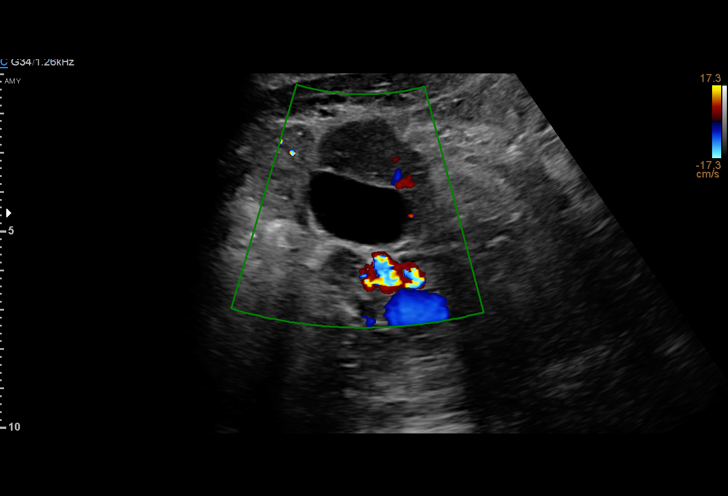
[im 7/16]
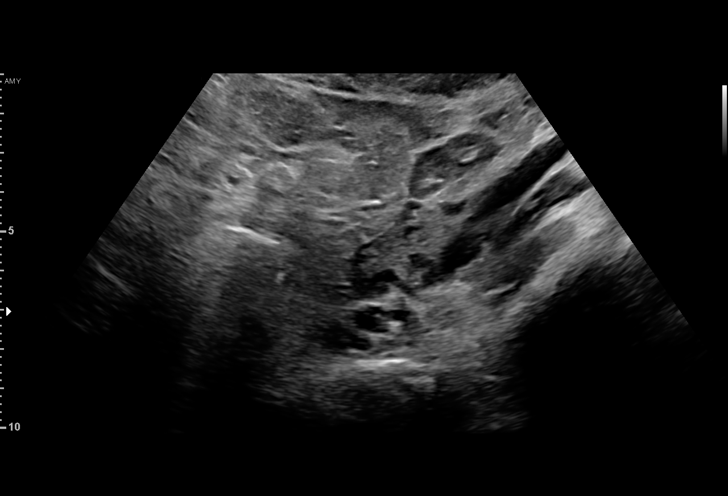
[im 9/16]
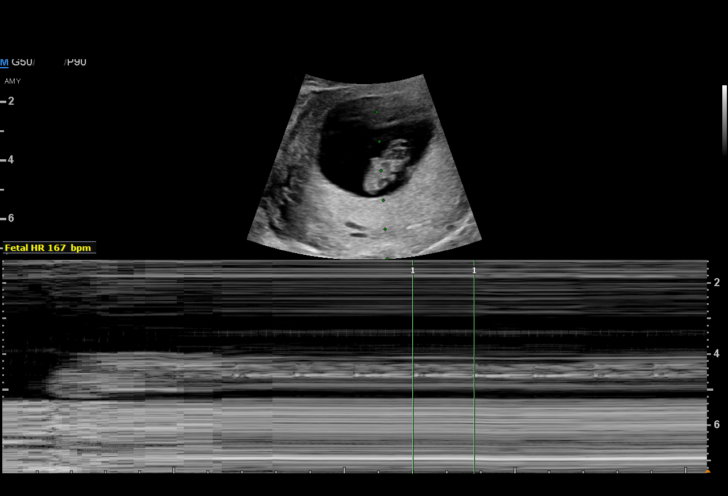
[im 10/16]
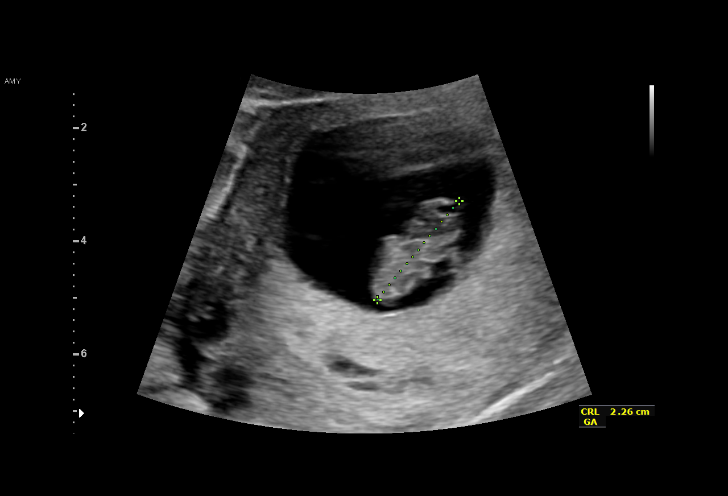
[im 11/16]
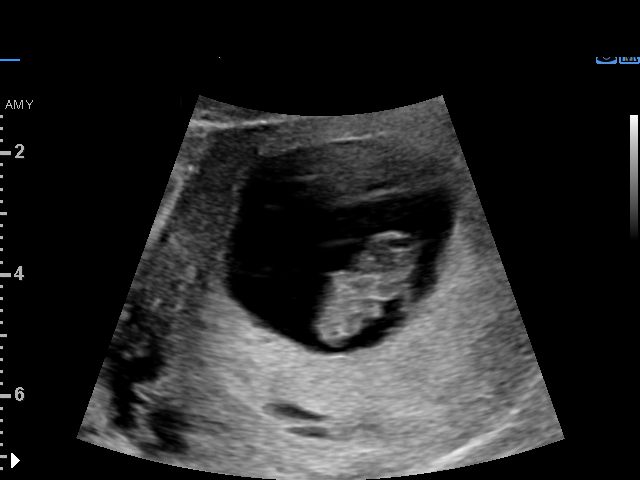
[im 12/16]
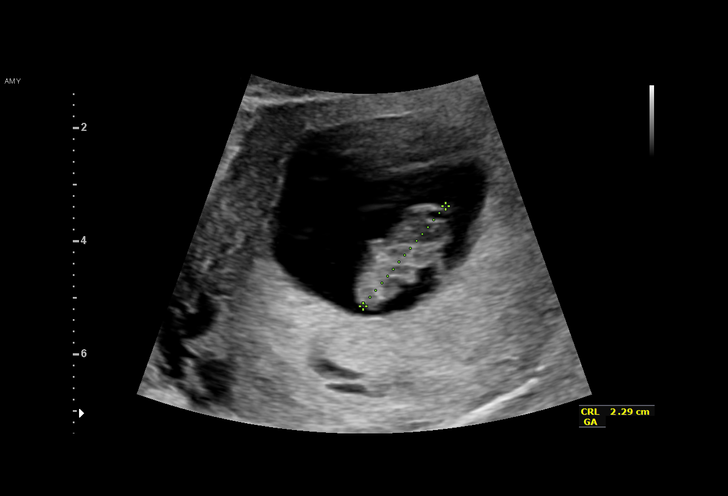
[im 13/16]
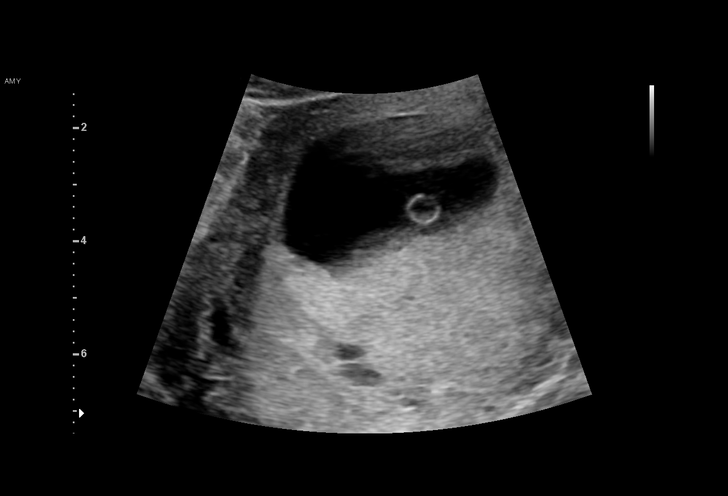
[im 14/16]
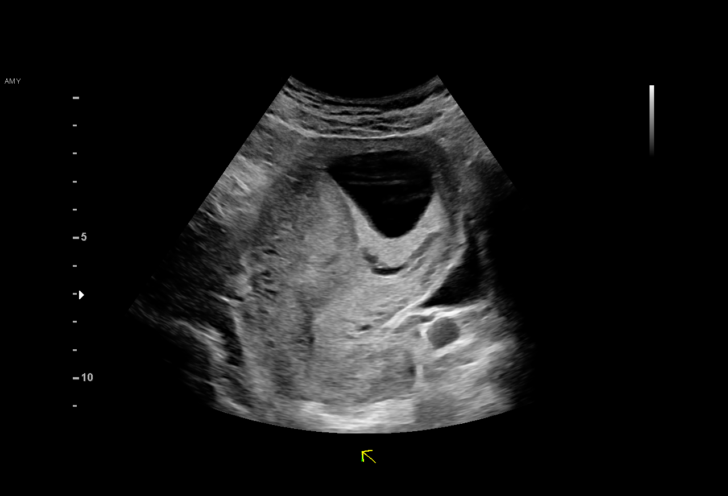
[im 15/16]
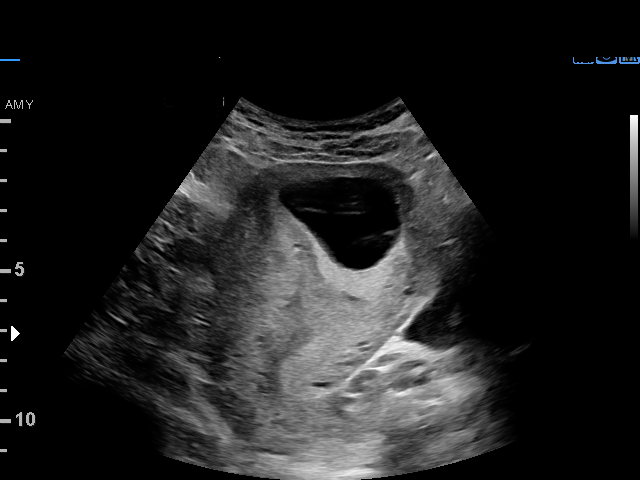
[im 16/16]
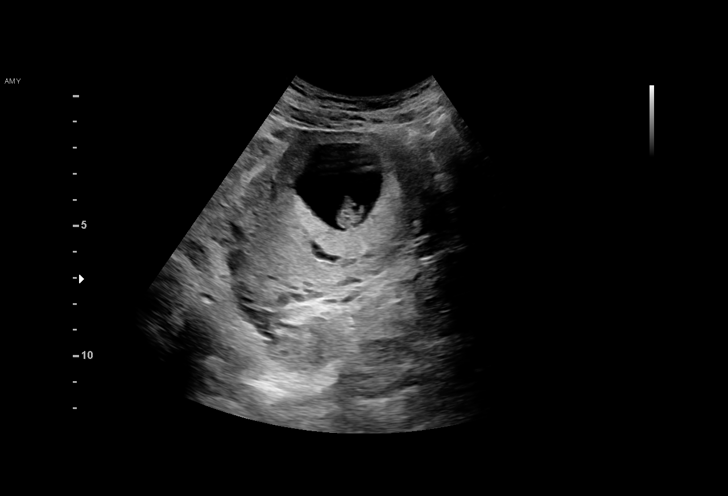

[15 of 16 positions shown; findings below may reference images not displayed]

FINDINGS: Intrauterine gestational sac: Single

Yolk sac:  Visualized

Embryo:  Visualize

Cardiac Activity: Visualized

Heart Rate: 167  bpm

CRL:  22.8  mm   8 w   6 d                  US EDC: 07/13/2017

Subchorionic hemorrhage: Small posterior 12 x 5 mm focus of
hemorrhage.

Maternal uterus/adnexae: Corpus luteum on the right. Normal left
ovary. No free fluid.
IMPRESSION: Single live intrauterine 8 week 6 day gestation with ultrasound EDC
of 07/13/2017. Small posterior focus of perigestational hemorrhage
measuring 12 x 5 mm.
